# Patient Record
Sex: Female | Born: 1950 | Race: White | Hispanic: No | Marital: Married | Smoking: Never smoker
Health system: Southern US, Community
[De-identification: ages and names within clinical notes are randomized; demographics above are authoritative.]

## PROBLEM LIST (undated history)

## (undated) DIAGNOSIS — T7840XA Allergy, unspecified, initial encounter: Secondary | ICD-10-CM

## (undated) DIAGNOSIS — F319 Bipolar disorder, unspecified: Secondary | ICD-10-CM

## (undated) DIAGNOSIS — I10 Essential (primary) hypertension: Secondary | ICD-10-CM

## (undated) DIAGNOSIS — M797 Fibromyalgia: Secondary | ICD-10-CM

## (undated) DIAGNOSIS — C801 Malignant (primary) neoplasm, unspecified: Secondary | ICD-10-CM

## (undated) DIAGNOSIS — G8929 Other chronic pain: Secondary | ICD-10-CM

## (undated) DIAGNOSIS — F419 Anxiety disorder, unspecified: Secondary | ICD-10-CM

## (undated) DIAGNOSIS — F32A Depression, unspecified: Secondary | ICD-10-CM

## (undated) DIAGNOSIS — R519 Headache, unspecified: Secondary | ICD-10-CM

## (undated) DIAGNOSIS — E7800 Pure hypercholesterolemia, unspecified: Secondary | ICD-10-CM

## (undated) HISTORY — DX: Fibromyalgia: M79.7

## (undated) HISTORY — PX: BREAST ENHANCEMENT SURGERY: SHX7

## (undated) HISTORY — DX: Headache, unspecified: R51.9

## (undated) HISTORY — DX: Depression, unspecified: F32.A

## (undated) HISTORY — DX: Essential (primary) hypertension: I10

## (undated) HISTORY — DX: Allergy, unspecified, initial encounter: T78.40XA

## (undated) HISTORY — DX: Pure hypercholesterolemia, unspecified: E78.00

## (undated) HISTORY — PX: OTHER SURGICAL HISTORY: SHX169

## (undated) HISTORY — PX: ESOPHAGOGASTRODUODENOSCOPY: SHX1529

## (undated) HISTORY — DX: Other chronic pain: G89.29

## (undated) HISTORY — PX: VAGINAL PROLAPSE REPAIR: SHX830

## (undated) HISTORY — PX: ABDOMINAL HYSTERECTOMY: SHX81

## (undated) HISTORY — PX: AUGMENTATION MAMMAPLASTY: SUR837

---

## 1990-02-04 HISTORY — PX: ULNA OSTEOTOMY: SHX1077

## 1998-02-14 ENCOUNTER — Emergency Department (HOSPITAL_COMMUNITY): Admission: EM | Admit: 1998-02-14 | Discharge: 1998-02-14 | Payer: Self-pay

## 2001-01-21 ENCOUNTER — Encounter: Admission: RE | Admit: 2001-01-21 | Discharge: 2001-04-21 | Payer: Self-pay

## 2001-02-16 ENCOUNTER — Ambulatory Visit (HOSPITAL_COMMUNITY): Admission: RE | Admit: 2001-02-16 | Discharge: 2001-02-16 | Payer: Self-pay

## 2001-04-29 ENCOUNTER — Encounter: Admission: RE | Admit: 2001-04-29 | Discharge: 2001-07-28 | Payer: Self-pay

## 2001-05-06 ENCOUNTER — Encounter: Admission: RE | Admit: 2001-05-06 | Discharge: 2001-08-04 | Payer: Self-pay

## 2001-06-24 ENCOUNTER — Other Ambulatory Visit: Admission: RE | Admit: 2001-06-24 | Discharge: 2001-06-24 | Payer: Self-pay | Admitting: Gynecology

## 2001-12-09 ENCOUNTER — Inpatient Hospital Stay (HOSPITAL_COMMUNITY): Admission: RE | Admit: 2001-12-09 | Discharge: 2001-12-10 | Payer: Self-pay | Admitting: Gynecology

## 2003-01-14 ENCOUNTER — Ambulatory Visit (HOSPITAL_COMMUNITY): Admission: RE | Admit: 2003-01-14 | Discharge: 2003-01-14 | Payer: Self-pay | Admitting: Internal Medicine

## 2003-06-06 ENCOUNTER — Emergency Department (HOSPITAL_COMMUNITY): Admission: EM | Admit: 2003-06-06 | Discharge: 2003-06-06 | Payer: Self-pay | Admitting: Family Medicine

## 2003-08-31 ENCOUNTER — Ambulatory Visit (HOSPITAL_COMMUNITY): Admission: RE | Admit: 2003-08-31 | Discharge: 2003-08-31 | Payer: Self-pay | Admitting: Orthopedic Surgery

## 2003-09-16 ENCOUNTER — Ambulatory Visit (HOSPITAL_BASED_OUTPATIENT_CLINIC_OR_DEPARTMENT_OTHER): Admission: RE | Admit: 2003-09-16 | Discharge: 2003-09-16 | Payer: Self-pay | Admitting: Orthopedic Surgery

## 2003-09-16 ENCOUNTER — Ambulatory Visit (HOSPITAL_COMMUNITY): Admission: RE | Admit: 2003-09-16 | Discharge: 2003-09-16 | Payer: Self-pay | Admitting: Orthopedic Surgery

## 2004-02-27 ENCOUNTER — Ambulatory Visit: Payer: Self-pay | Admitting: Internal Medicine

## 2004-03-29 ENCOUNTER — Emergency Department (HOSPITAL_COMMUNITY): Admission: EM | Admit: 2004-03-29 | Discharge: 2004-03-29 | Payer: Self-pay | Admitting: Emergency Medicine

## 2004-03-30 ENCOUNTER — Emergency Department (HOSPITAL_COMMUNITY): Admission: EM | Admit: 2004-03-30 | Discharge: 2004-03-30 | Payer: Self-pay | Admitting: Emergency Medicine

## 2004-04-07 ENCOUNTER — Emergency Department (HOSPITAL_COMMUNITY): Admission: EM | Admit: 2004-04-07 | Discharge: 2004-04-07 | Payer: Self-pay | Admitting: Family Medicine

## 2004-04-24 ENCOUNTER — Emergency Department (HOSPITAL_COMMUNITY): Admission: EM | Admit: 2004-04-24 | Discharge: 2004-04-24 | Payer: Self-pay | Admitting: Emergency Medicine

## 2004-04-25 ENCOUNTER — Inpatient Hospital Stay (HOSPITAL_COMMUNITY): Admission: EM | Admit: 2004-04-25 | Discharge: 2004-05-08 | Payer: Self-pay | Admitting: Psychiatry

## 2004-04-25 ENCOUNTER — Ambulatory Visit (HOSPITAL_COMMUNITY): Admission: RE | Admit: 2004-04-25 | Discharge: 2004-04-25 | Payer: Self-pay | Admitting: Psychiatry

## 2004-04-25 ENCOUNTER — Ambulatory Visit: Payer: Self-pay | Admitting: Psychiatry

## 2004-05-03 ENCOUNTER — Ambulatory Visit (HOSPITAL_COMMUNITY): Admission: RE | Admit: 2004-05-03 | Discharge: 2004-05-03 | Payer: Self-pay | Admitting: Psychiatry

## 2004-06-07 ENCOUNTER — Emergency Department (HOSPITAL_COMMUNITY): Admission: EM | Admit: 2004-06-07 | Discharge: 2004-06-07 | Payer: Self-pay | Admitting: Emergency Medicine

## 2004-06-19 ENCOUNTER — Ambulatory Visit: Payer: Self-pay | Admitting: Internal Medicine

## 2004-06-29 ENCOUNTER — Encounter: Admission: RE | Admit: 2004-06-29 | Discharge: 2004-06-29 | Payer: Self-pay | Admitting: Family Medicine

## 2004-07-12 ENCOUNTER — Ambulatory Visit: Payer: Self-pay | Admitting: Internal Medicine

## 2009-11-29 ENCOUNTER — Encounter (INDEPENDENT_AMBULATORY_CARE_PROVIDER_SITE_OTHER): Payer: Self-pay | Admitting: *Deleted

## 2010-02-25 ENCOUNTER — Encounter: Payer: Self-pay | Admitting: Internal Medicine

## 2010-03-06 NOTE — Letter (Signed)
Summary: LEC Referral (unable to schedule) Notification  [facility]  [address] [place], [place] [address]   Phone: [phone]  Fax: [phone]      November 29, 2009 Angela Ortiz 1950/12/26 MRN: [id number]   [facility] [id number]  [place], [place]  [address]   Dear Dr.Pang:   Thank you for your kind referral of the above patient. We have attempted to schedule the recommended Colonoscopy but have been unable to schedule because:  __ The patient was not available by phone and/or has not returned our calls.  _x_ The patient declined to schedule the procedure at this time.  We appreciate the referral and hope that we will have the opportunity to treat this patient in the future.    Sincerely,   [facility]  Vania Rea. Jarold Motto M.D. Hedwig Morton. Juanda Chance M.D. Venita Lick. Russella Dar M.D. Wilhemina Bonito. Marina Goodell M.D. Barbette Hair. Arlyce Dice M.D. Iva Boop M.D. Cheron Every.D.

## 2010-09-26 ENCOUNTER — Emergency Department (HOSPITAL_COMMUNITY)
Admission: EM | Admit: 2010-09-26 | Discharge: 2010-09-26 | Discharge status: Against Medical Advice | Payer: BC Managed Care – PPO | Attending: Emergency Medicine | Admitting: Emergency Medicine

## 2010-09-26 DIAGNOSIS — R5381 Other malaise: Secondary | ICD-10-CM | POA: Insufficient documentation

## 2010-09-26 DIAGNOSIS — R5383 Other fatigue: Secondary | ICD-10-CM | POA: Insufficient documentation

## 2010-09-26 DIAGNOSIS — R110 Nausea: Secondary | ICD-10-CM | POA: Insufficient documentation

## 2013-07-02 ENCOUNTER — Other Ambulatory Visit: Payer: Self-pay | Admitting: Internal Medicine

## 2013-07-02 DIAGNOSIS — R109 Unspecified abdominal pain: Secondary | ICD-10-CM

## 2013-07-08 ENCOUNTER — Encounter (INDEPENDENT_AMBULATORY_CARE_PROVIDER_SITE_OTHER): Payer: Self-pay

## 2013-07-08 ENCOUNTER — Ambulatory Visit
Admission: RE | Admit: 2013-07-08 | Discharge: 2013-07-08 | Disposition: A | Discharge status: Home / Self Care | Payer: BC Managed Care – PPO | Source: Ambulatory Visit | Attending: Internal Medicine | Admitting: Internal Medicine

## 2013-07-08 DIAGNOSIS — R109 Unspecified abdominal pain: Secondary | ICD-10-CM

## 2013-07-08 MED ORDER — IOHEXOL 300 MG/ML  SOLN
100.0000 mL | Freq: Once | INTRAMUSCULAR | Status: AC | PRN
  Administered 2013-07-08: 100 mL via INTRAVENOUS

## 2014-05-17 ENCOUNTER — Emergency Department (HOSPITAL_COMMUNITY)
Admission: EM | Admit: 2014-05-17 | Discharge: 2014-05-18 | Disposition: A | Discharge status: Home / Self Care | Payer: Medicare Other | Attending: Emergency Medicine | Admitting: Emergency Medicine

## 2014-05-17 ENCOUNTER — Emergency Department (HOSPITAL_COMMUNITY): Payer: Medicare Other

## 2014-05-17 ENCOUNTER — Encounter (HOSPITAL_COMMUNITY): Payer: Self-pay | Admitting: Emergency Medicine

## 2014-05-17 DIAGNOSIS — R443 Hallucinations, unspecified: Secondary | ICD-10-CM

## 2014-05-17 DIAGNOSIS — G4700 Insomnia, unspecified: Secondary | ICD-10-CM

## 2014-05-17 DIAGNOSIS — F419 Anxiety disorder, unspecified: Secondary | ICD-10-CM | POA: Diagnosis present

## 2014-05-17 DIAGNOSIS — F410 Panic disorder [episodic paroxysmal anxiety] without agoraphobia: Secondary | ICD-10-CM | POA: Insufficient documentation

## 2014-05-17 DIAGNOSIS — Z85830 Personal history of malignant neoplasm of bone: Secondary | ICD-10-CM | POA: Insufficient documentation

## 2014-05-17 HISTORY — DX: Bipolar disorder, unspecified: F31.9

## 2014-05-17 HISTORY — DX: Anxiety disorder, unspecified: F41.9

## 2014-05-17 HISTORY — DX: Malignant (primary) neoplasm, unspecified: C80.1

## 2014-05-17 LAB — CBC
HCT: 38.9 % (ref 36.0–46.0)
Hemoglobin: 13.4 g/dL (ref 12.0–15.0)
MCH: 31.3 pg (ref 26.0–34.0)
MCHC: 34.4 g/dL (ref 30.0–36.0)
MCV: 90.9 fL (ref 78.0–100.0)
Platelets: 242 10*3/uL (ref 150–400)
RBC: 4.28 MIL/uL (ref 3.87–5.11)
RDW: 12.9 % (ref 11.5–15.5)
WBC: 10.8 10*3/uL — ABNORMAL HIGH (ref 4.0–10.5)

## 2014-05-17 LAB — URINE MICROSCOPIC-ADD ON

## 2014-05-17 LAB — COMPREHENSIVE METABOLIC PANEL
ALT: 16 U/L (ref 0–35)
AST: 34 U/L (ref 0–37)
Albumin: 4.8 g/dL (ref 3.5–5.2)
Alkaline Phosphatase: 57 U/L (ref 39–117)
Anion gap: 12 (ref 5–15)
BUN: 9 mg/dL (ref 6–23)
CO2: 25 mmol/L (ref 19–32)
Calcium: 9.4 mg/dL (ref 8.4–10.5)
Chloride: 95 mmol/L — ABNORMAL LOW (ref 96–112)
Creatinine, Ser: 0.66 mg/dL (ref 0.50–1.10)
GFR calc Af Amer: >90 mL/min (ref >90)
GFR calc non Af Amer: >90 mL/min (ref >90)
Glucose, Bld: 122 mg/dL — ABNORMAL HIGH (ref 70–99)
Potassium: 3.1 mmol/L — ABNORMAL LOW (ref 3.5–5.1)
Sodium: 132 mmol/L — ABNORMAL LOW (ref 135–145)
Total Bilirubin: 1 mg/dL (ref 0.3–1.2)
Total Protein: 7.3 g/dL (ref 6.0–8.3)

## 2014-05-17 LAB — URINALYSIS, ROUTINE W REFLEX MICROSCOPIC
Bilirubin Urine: NEGATIVE
Glucose, UA: NEGATIVE mg/dL
Ketones, ur: 15 mg/dL — AB
Nitrite: NEGATIVE
Protein, ur: NEGATIVE mg/dL
Specific Gravity, Urine: 1.003 — ABNORMAL LOW (ref 1.005–1.030)
Urobilinogen, UA: 0.2 mg/dL (ref 0.0–1.0)
pH: 8 (ref 5.0–8.0)

## 2014-05-17 LAB — RAPID URINE DRUG SCREEN, HOSP PERFORMED
Amphetamines: NOT DETECTED
Barbiturates: NOT DETECTED
Benzodiazepines: NOT DETECTED
Cocaine: NOT DETECTED
Opiates: NOT DETECTED
Tetrahydrocannabinol: NOT DETECTED

## 2014-05-17 LAB — SALICYLATE LEVEL: Salicylate Lvl: <4 mg/dL (ref 2.8–20.0)

## 2014-05-17 LAB — ETHANOL: Alcohol, Ethyl (B): <5 mg/dL (ref 0–9)

## 2014-05-17 LAB — ACETAMINOPHEN LEVEL: Acetaminophen (Tylenol), Serum: <10 ug/mL — ABNORMAL LOW (ref 10–30)

## 2014-05-17 MED ORDER — ACETAMINOPHEN 325 MG PO TABS
650.0000 mg | ORAL_TABLET | ORAL | Status: DC | PRN
  Administered 2014-05-18: 650 mg via ORAL
  Filled 2014-05-17: qty 2

## 2014-05-17 MED ORDER — LORAZEPAM 1 MG PO TABS
1.0000 mg | ORAL_TABLET | Freq: Three times a day (TID) | ORAL | Status: DC | PRN
  Administered 2014-05-18: 1 mg via ORAL
  Filled 2014-05-17: qty 1
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
## 2014-05-17 NOTE — ED Notes (Signed)
Patient sister Angela Ortiz) [phone]  Patient gave verbal consent for sister to be updated on POC.

## 2014-05-17 NOTE — ED Notes (Signed)
Patient transported to X-ray 

## 2014-05-17 NOTE — ED Notes (Signed)
Bed: WA16 Expected date:  Expected time:  Means of arrival:  Comments: tr1 

## 2014-05-17 NOTE — ED Notes (Signed)
Pt's belongings placed in [place].

## 2014-05-17 NOTE — ED Provider Notes (Signed)
CSN: [id number]     Arrival date & time 05/17/14  1833 History   First MD Initiated Contact with Patient 05/17/14 1954     Chief Complaint  Patient presents with  . Hallucinations    visual  . Panic Attack    throught the day, hx of same     (Consider location/radiation/quality/duration/timing/severity/associated sxs/prior Treatment) The history is provided by the patient.   patient presents with auditory and visual hallucinations. Has also been more anxious. History of anxiety and bipolar disorder. No suicidal thoughts but states she's been in with a lot of stress. Has been seeing things moving along the walls in her room. Sent in by her psychiatrist. She denies substance abuse.  Past Medical History  Diagnosis Date  . Bipolar disorder   . Anxiety   . 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 Right arm surgery    . Breast enhancement surgery    . Abdominal hysterectomy     History reviewed. No pertinent family history. History  Substance Use Topics  . Smoking status: Never Smoker   . Smokeless tobacco: Not on file  . Alcohol Use: 4.2 oz/week    7 Glasses of wine per week   OB History    No data available     Review of Systems  Constitutional: Negative for activity change and appetite change.  Eyes: Negative for pain.  Respiratory: Negative for chest tightness and shortness of breath.   Cardiovascular: Negative for chest pain and leg swelling.  Gastrointestinal: Negative for nausea, vomiting, abdominal pain and diarrhea.  Genitourinary: Negative for flank pain.  Musculoskeletal: Negative for back pain and neck stiffness.  Skin: Negative for rash.  Neurological: Negative for weakness, numbness and headaches.  Psychiatric/Behavioral: Positive for hallucinations and behavioral problems. The patient is nervous/anxious.       Allergies  Review of patient's allergies indicates no known allergies.  Home Medications    Prior to Admission medications   Not on File   BP 140/70 mmHg  Pulse 77  Temp(Src) 98.4 F (36.9 C) (Oral)  Resp 20  Ht 5\' 6"  (1.676 m)  Wt 155 lb (70.308 kg)  BMI 25.03 kg/m2  SpO2 95% Physical Exam  Constitutional: She is oriented to person, place, and time. She appears well-developed and well-nourished.  HENT:  Head: Normocephalic and atraumatic.  Eyes: EOM are normal. Pupils are equal, round, and reactive to light.  Neck: Normal range of motion.  Cardiovascular: Normal rate, regular rhythm and normal heart sounds.   Pulmonary/Chest: Effort normal and breath sounds normal. No respiratory distress.  Abdominal: Soft. There is no tenderness.  Musculoskeletal: Normal range of motion.  Neurological: She is alert and oriented to person, place, and time. No cranial nerve deficit.  Skin: Skin is warm and dry.  Psychiatric: Her speech is normal.  Patient appears somewhat anxious.  Nursing note and vitals reviewed.   ED Course  Procedures (including critical care time) Labs Review Labs Reviewed  ACETAMINOPHEN LEVEL - Abnormal; Notable for the following:    Acetaminophen (Tylenol), Serum <10.0 (*)    All other components within normal limits  CBC - Abnormal; Notable for the following:    WBC 10.8 (*)    All other components within normal limits  COMPREHENSIVE METABOLIC PANEL - Abnormal; Notable for the following:    Sodium 132 (*)    Potassium 3.1 (*)    Chloride 95 (*)  Glucose, Bld 122 (*)    All other components within normal limits  URINALYSIS, ROUTINE W REFLEX MICROSCOPIC - Abnormal; Notable for the following:    Specific Gravity, Urine 1.003 (*)    Hgb urine dipstick SMALL (*)    Ketones, ur 15 (*)    Leukocytes, UA SMALL (*)    All other components within normal limits  ETHANOL  SALICYLATE LEVEL  URINE RAPID DRUG SCREEN (HOSP PERFORMED)  URINE MICROSCOPIC-ADD ON    Imaging Review Dg Chest 2 View  05/17/2014   CLINICAL DATA:  Hallucinations. Panic  attack. Remote history of right upper extremity carcinoma.  EXAM: CHEST  2 VIEW  COMPARISON:  08/18/2013  FINDINGS: Lateral view degraded by patient arm position. Calcified left breast implant. Midline trachea. Normal heart size and mediastinal contours. No pleural effusion or pneumothorax. Mild right infrahilar scarring or subsegmental atelectasis.  IMPRESSION: No acute cardiopulmonary disease.   Electronically Signed   By: Abigail Miyamoto M.D.   On: 05/17/2014 21:05     EKG Interpretation None      MDM   Final diagnoses:  Hallucinations  Anxiety    Patient with hallucinations and anxiety. History of same and bipolar disorder. Appears to medically cleared at this time. To be seen by TTS.    Davonna Belling, MD 05/17/14 2154

## 2014-05-17 NOTE — ED Notes (Addendum)
Patient reports hx of bipolar and panic attacks, states she has been having panic attacks throughout the day. Sent by psychiatrist (Dr. Edwyna Perfect). Patient also reports visual hallucinations, patterns on wall moving, denies anything scary or frightening. Patient ran out of Xanax yesterday, states she is taking her medications for bipolar. Denies SI/HI. Patient has been telling sister that she has been afraid of dieing the past few days, states "in the middle of the night she goes to dark places", is seeing another solar system. Patient states to her sister she found a piece of plastic in her wallet today and swallowed it. Patient has been spitting and telling her sister "look there's the plastic", sister has been unable to visualize anything abnormal in the spit.

## 2014-05-17 NOTE — Progress Notes (Signed)
EDCM spoke to patient at bedside. Patient presents to Ed with auditory and visual hallucinations. Patient with pmhx of bipolar and anxiety.   Patient confirms her pcp is Dr. Tommy Medal.  Patient's psychiatrist is Dr. Toy Care on [address] [phone].  [facility] asked patient if she is taking her medications correctly?  Patient reports she ran out of her xanax yesterday.  Patient also reports she has not been taking her antidepressant medications.  Patient unable to tell [facility] name of antidepressant medication she takes.  [facility] asked patient if she has had hallucinations in the past?  Patient reports she has had hallucinations in the past but cannot remember what made it better.  Patient very pleasant when [facility] spoke with her at this time.  TTS consult placed. No further EDCM needs at this time.

## 2014-05-18 DIAGNOSIS — F419 Anxiety disorder, unspecified: Secondary | ICD-10-CM | POA: Diagnosis not present

## 2014-05-18 DIAGNOSIS — F410 Panic disorder [episodic paroxysmal anxiety] without agoraphobia: Secondary | ICD-10-CM | POA: Diagnosis present

## 2014-05-18 DIAGNOSIS — G4700 Insomnia, unspecified: Secondary | ICD-10-CM

## 2014-05-18 NOTE — Progress Notes (Addendum)
Per psychiatrist, patient psychiatrically stable for discharge home. Pt is followed by Dr. Starleen Arms office. Pt recommended to see therapist in Dr. Starleen Arms office. Friday 4/15 at 12pm with Dr. Toy Care. Patient referred to [facility]. Pt has an appointment tomorrow at [place] for therapy. Pt agreeable to disposition plan. Pt calling husband and sister for ride home.    Angela Ortiz, [place] Work  [employer] [phone]

## 2014-05-18 NOTE — ED Notes (Signed)
Psych Team in with patient.

## 2014-05-18 NOTE — Consult Note (Signed)
[facility] Consult   Reason for Consult:  Anxiety, panic disorder Referring Physician:  EDP Patient Identification: Angela Ortiz MRN:  [id number] Principal Diagnosis: <principal problem not specified> Diagnosis:   Patient Active Problem List   Diagnosis Date Noted  . Anxiety [F41.9] 05/18/2014    Priority: High  . Panic disorder [F41.0] 05/18/2014    Priority: High  . Insomnia [G47.00] 05/18/2014    Priority: High    Total Time spent with patient: 45 minutes  Subjective:   Angela Ortiz is a 64 y.o. female patient has stabilized.  HPI:  The patient has been having anxiety for years and taking Xanax 1 mg QID for the past 3-4 years.  She has been over taking her Xanax and ran out yesterday.  Angela Ortiz then started having an increase in anxiety with a panic attack.  She states she was worried about the end of the world after watching a documentary about a new planet written by a physicist.  Denies suicidal/homicidal ideations, hallucinations, and alcohol/illegal drug abuse, no past suicide attempts or hospitalization.  She is a patient of Dr. Toy Care but does not have a therapist but willing to go to one.  Angela Ortiz lives with her husband and has supportive adult children.  Stable for discharge. HPI Elements:   Location:  generalized. Quality:  acute. Severity:  mild. Timing:  intermttent. Duration:  since yesterday. Context:  stressors, ran out of her Xanax.  Past Medical History:  Past Medical History  Diagnosis Date  . Bipolar disorder   . Anxiety   . 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 Right arm surgery    . Breast enhancement surgery    . Abdominal hysterectomy     Family History: History reviewed. No pertinent family history. Social History:  History  Alcohol Use  . 4.2 oz/week  . 7 Glasses of wine per week     History  Drug Use No    History   Social History  . Marital Status: Married    Spouse  Name: N/A  . Number of Children: N/A  . Years of Education: N/A   Social History Main Topics  . Smoking status: Never Smoker   . Smokeless tobacco: Not on file  . Alcohol Use: 4.2 oz/week    7 Glasses of wine per week  . Drug Use: No  . Sexual Activity: Not on file   Other Topics Concern  . None   Social History Narrative  . None   Additional Social History:    Pain Medications: Tramadol for fiibromyalgia Prescriptions: Xanax, and other medications.  See PTA medication list Over the Counter: Calcium, ibuprophen when needed, Vitamin D-3 History of alcohol / drug use?: No history of alcohol / drug abuse                     Allergies:  No Known Allergies  Labs:  Results for orders placed or performed during the hospital encounter of 05/17/14 (from the past 48 hour(s))  Urine Drug Screen     Status: None   Collection Time: 05/17/14  7:53 PM  Result Value Ref Range   Opiates NONE DETECTED NONE DETECTED   Cocaine NONE DETECTED NONE DETECTED   Benzodiazepines NONE DETECTED NONE DETECTED   Amphetamines NONE DETECTED NONE DETECTED   Tetrahydrocannabinol NONE DETECTED NONE DETECTED   Barbiturates NONE DETECTED NONE DETECTED    Comment:  DRUG SCREEN FOR MEDICAL PURPOSES ONLY.  IF CONFIRMATION IS NEEDED FOR ANY PURPOSE, NOTIFY LAB WITHIN 5 DAYS.        LOWEST DETECTABLE LIMITS FOR URINE DRUG SCREEN Drug Class       Cutoff (ng/mL) Amphetamine      1000 Barbiturate      200 Benzodiazepine   845 Tricyclics       364 Opiates          300 Cocaine          300 THC              50   Urinalysis, Routine w reflex microscopic     Status: Abnormal   Collection Time: 05/17/14  7:53 PM  Result Value Ref Range   Color, Urine YELLOW YELLOW   APPearance CLEAR CLEAR   Specific Gravity, Urine 1.003 (L) 1.005 - 1.030   pH 8.0 5.0 - 8.0   Glucose, UA NEGATIVE NEGATIVE mg/dL   Hgb urine dipstick SMALL (A) NEGATIVE   Bilirubin Urine NEGATIVE NEGATIVE   Ketones, ur 15 (A)  NEGATIVE mg/dL   Protein, ur NEGATIVE NEGATIVE mg/dL   Urobilinogen, UA 0.2 0.0 - 1.0 mg/dL   Nitrite NEGATIVE NEGATIVE   Leukocytes, UA SMALL (A) NEGATIVE  Urine microscopic-add on     Status: None   Collection Time: 05/17/14  7:53 PM  Result Value Ref Range   Squamous Epithelial / LPF RARE RARE   WBC, UA 3-6 <3 WBC/hpf   RBC / HPF 0-2 <3 RBC/hpf   Bacteria, UA RARE RARE  Acetaminophen level     Status: Abnormal   Collection Time: 05/17/14  8:29 PM  Result Value Ref Range   Acetaminophen (Tylenol), Serum <10.0 (L) 10 - 30 ug/mL    Comment:        THERAPEUTIC CONCENTRATIONS VARY SIGNIFICANTLY. A RANGE OF 10-30 ug/mL MAY BE AN EFFECTIVE CONCENTRATION FOR MANY PATIENTS. HOWEVER, SOME ARE BEST TREATED AT CONCENTRATIONS OUTSIDE THIS RANGE. ACETAMINOPHEN CONCENTRATIONS >150 ug/mL AT 4 HOURS AFTER INGESTION AND >50 ug/mL AT 12 HOURS AFTER INGESTION ARE OFTEN ASSOCIATED WITH TOXIC REACTIONS.   CBC     Status: Abnormal   Collection Time: 05/17/14  8:29 PM  Result Value Ref Range   WBC 10.8 (H) 4.0 - 10.5 K/uL   RBC 4.28 3.87 - 5.11 MIL/uL   Hemoglobin 13.4 12.0 - 15.0 g/dL   HCT 38.9 36.0 - 46.0 %   MCV 90.9 78.0 - 100.0 fL   MCH 31.3 26.0 - 34.0 pg   MCHC 34.4 30.0 - 36.0 g/dL   RDW 12.9 11.5 - 15.5 %   Platelets 242 150 - 400 K/uL  Comprehensive metabolic panel     Status: Abnormal   Collection Time: 05/17/14  8:29 PM  Result Value Ref Range   Sodium 132 (L) 135 - 145 mmol/L   Potassium 3.1 (L) 3.5 - 5.1 mmol/L   Chloride 95 (L) 96 - 112 mmol/L   CO2 25 19 - 32 mmol/L   Glucose, Bld 122 (H) 70 - 99 mg/dL   BUN 9 6 - 23 mg/dL   Creatinine, Ser 0.66 0.50 - 1.10 mg/dL   Calcium 9.4 8.4 - 10.5 mg/dL   Total Protein 7.3 6.0 - 8.3 g/dL   Albumin 4.8 3.5 - 5.2 g/dL   AST 34 0 - 37 U/L   ALT 16 0 - 35 U/L   Alkaline Phosphatase 57 39 - 117 U/L   Total Bilirubin 1.0 0.3 -  1.2 mg/dL   GFR calc non Af Amer >90 >90 mL/min   GFR calc Af Amer >90 >90 mL/min    Comment:  (NOTE) The eGFR has been calculated using the CKD EPI equation. This calculation has not been validated in all clinical situations. eGFR's persistently <90 mL/min signify possible Chronic Kidney Disease.    Anion gap 12 5 - 15  Ethanol (ETOH)     Status: None   Collection Time: 05/17/14  8:29 PM  Result Value Ref Range   Alcohol, Ethyl (B) <5 0 - 9 mg/dL    Comment:        LOWEST DETECTABLE LIMIT FOR SERUM ALCOHOL IS 11 mg/dL FOR MEDICAL PURPOSES ONLY   Salicylate level     Status: None   Collection Time: 05/17/14  8:29 PM  Result Value Ref Range   Salicylate Lvl <2.2 2.8 - 20.0 mg/dL    Vitals: Blood pressure 123/59, pulse 73, temperature 98.4 F (36.9 C), temperature source Oral, resp. rate 16, height _0  (1.676 m), weight 155 lb (70.308 kg), SpO2 97 %.  Risk to Self: Suicidal Ideation: No Suicidal Intent: No Is patient at risk for suicide?: No Suicidal Plan?: No Access to Means: No What has been your use of drugs/alcohol within the last 12 months?: One glass of wine in a week How many times?: 0 Other Self Harm Risks: None Triggers for Past Attempts: None known Intentional Self Injurious Behavior: None Risk to Others: Homicidal Ideation: No Thoughts of Harm to Others: No Current Homicidal Intent: No Current Homicidal Plan: No Access to Homicidal Means: No Identified Victim: No one History of harm to others?: No Assessment of Violence: None Noted Violent Behavior Description: No one Does patient have access to weapons?: No Criminal Charges Pending?: No Does patient have a court date: No Prior Inpatient Therapy: Prior Inpatient Therapy: Yes Prior Therapy Dates: 10 years ago Prior Therapy Facilty/Provider([facility]): [facility] Reason for Treatment: panic attacks Prior Outpatient Therapy: Prior Outpatient Therapy: Yes Prior Therapy Dates: 10 years ago Prior Therapy Facilty/Provider(s): Dr. Toy Care Reason for Treatment: med management  Current Facility-Administered Medications   Medication Dose Route Frequency Provider Last Rate Last Dose  . acetaminophen (TYLENOL) tablet 650 mg  650 mg Oral Q4H PRN Davonna Belling, MD   650 mg at 05/18/14 9798   Current Outpatient Prescriptions  Medication Sig Dispense Refill  . ALPRAZolam (XANAX) 1 MG tablet Take 1 mg by mouth 4 (four) times daily.    Marland Kitchen atorvastatin (LIPITOR) 10 MG tablet Take 10 mg by mouth daily.    . traMADol (ULTRAM) 50 MG tablet Take 50 mg by mouth 3 (three) times daily.      Musculoskeletal: Strength & Muscle Tone: within normal limits Gait & Station: normal Patient leans: N/A  Psychiatric Specialty Exam:     Blood pressure 123/59, pulse 73, temperature 98.4 F (36.9 C), temperature source Oral, resp. rate 16, height _1  (1.676 m), weight 155 lb (70.308 kg), SpO2 97 %.Body mass index is 25.03 kg/(m^2).  General Appearance: Casual  Eye Contact::  Good  Speech:  Normal Rate  Volume:  Normal  Mood:  Euthymic  Affect:  Congruent  Thought Process:  Coherent  Orientation:  Full (Time, Place, and Person)  Thought Content:  WDL  Suicidal Thoughts:  No  Homicidal Thoughts:  No  Memory:  Immediate;   Good Recent;   Good Remote;   Good  Judgement:  Good  Insight:  Fair  Psychomotor Activity:  Normal  Concentration:  Good  Recall:  Good  Fund of Knowledge:Good  Language: Good  Akathisia:  No  Handed:  Right  AIMS (if indicated):     Assets:  Housing Intimacy Leisure Time Physical Health Resilience Social Support  ADL's:  Intact  Cognition: WNL  Sleep:      Medical Decision Making: Review of Psycho-Social Stressors (1), Review or order clinical lab tests (1) and Review of Medication Regimen & Side Effects (2)  Treatment Plan Summary: Daily contact with patient to assess and evaluate symptoms and progress in treatment, Medication management and Plan discharge home with follow-up with her regular provider, resources provided for therapy  Plan:  No evidence of imminent risk to self or  others at present.   Disposition: discharge home with follow-up with her regular provider, resources provided for therapy---therapist appointment tomorrow and appointment with Dr. Toy Care on Friday.  Waylan Boga, [place] 05/18/2014 12:32 PM Patient seen face-to-face for psychiatric evaluation, chart reviewed and case discussed with the physician extender and developed treatment plan. Reviewed the information documented and agree with the treatment plan. Corena Pilgrim, MD

## 2014-05-18 NOTE — [facility] Suicide Risk Assessment (Signed)
Suicide Risk Assessment  Discharge Assessment   [facility] Discharge Suicide Risk Assessment   Demographic Factors:  Age 65 or older and Caucasian  Total Time spent with patient: 45 minutes  Musculoskeletal: Strength & Muscle Tone: within normal limits Gait & Station: normal Patient leans: N/A  Psychiatric Specialty Exam:     Blood pressure 123/59, pulse 73, temperature 98.4 F (36.9 C), temperature source Oral, resp. rate 16, height 5\' 6"  (1.676 m), weight 155 lb (70.308 kg), SpO2 97 %.Body mass index is 25.03 kg/(m^2).  General Appearance: Casual  Eye Contact::  Good  Speech:  Normal Rate  Volume:  Normal  Mood:  Euthymic  Affect:  Congruent  Thought Process:  Coherent  Orientation:  Full (Time, Place, and Person)  Thought Content:  WDL  Suicidal Thoughts:  No  Homicidal Thoughts:  No  Memory:  Immediate;   Good Recent;   Good Remote;   Good  Judgement:  Good  Insight:  Fair  Psychomotor Activity:  Normal  Concentration:  Good  Recall:  Good  Fund of Knowledge:Good  Language: Good  Akathisia:  No  Handed:  Right  AIMS (if indicated):     Assets:  Housing Intimacy Leisure Time Physical Health Resilience Social Support  ADL's:  Intact  Cognition: WNL  Sleep:         Has this patient used any form of tobacco in the last 30 days? (Cigarettes, Smokeless Tobacco, Cigars, and/or Pipes) No  Mental Status Per Nursing Assessment::   On Admission:    Anxiety, panic disorder  Current Mental Status by Physician: NA  Loss Factors: NA  Historical Factors: NA  Risk Reduction Factors:   Sense of responsibility to family, Living with another person, especially a relative, Positive social support and Positive therapeutic relationship  Continued Clinical Symptoms:  None  Cognitive Features That Contribute To Risk:  None    Suicide Risk:  Minimal: No identifiable suicidal ideation.  Patients presenting with no risk factors but with morbid ruminations; may be  classified as minimal risk based on the severity of the depressive symptoms  Principal Problem: <principal problem not specified> Discharge Diagnoses:  Patient Active Problem List   Diagnosis Date Noted  . Anxiety [F41.9] 05/18/2014    Priority: High  . Panic disorder [F41.0] 05/18/2014    Priority: High  . Insomnia [G47.00] 05/18/2014    Priority: High    Follow-up Information    Follow up On 05/20/2014.   Why:  Dr. Toy Care Friday 05/20/2014 @ 12pm    Contact information:   [address]  [place], [place] [address]  [phone]      Follow up On 05/19/2014.   Why:  Therapist Foy Guadalajara. at 3:45pm for appointment at [place] information:   [address]  [place],  [address]  [phone]      Plan Of Care/Follow-up recommendations:  Activity:  as tolerated  Diet:  heart healthy diet  Is patient on multiple antipsychotic therapies at discharge:  No   Has Patient had three or more failed trials of antipsychotic monotherapy by history:  No  Recommended Plan for Multiple Antipsychotic Therapies: NA    Krithik Mapel, PMH-NP 05/18/2014, 12:41 PM

## 2014-06-07 ENCOUNTER — Emergency Department (HOSPITAL_COMMUNITY): Payer: Medicare Other

## 2014-06-07 ENCOUNTER — Emergency Department (HOSPITAL_COMMUNITY)
Admission: EM | Admit: 2014-06-07 | Discharge: 2014-06-08 | Disposition: A | Discharge status: Home / Self Care | Payer: Medicare Other | Attending: Emergency Medicine | Admitting: Emergency Medicine

## 2014-06-07 ENCOUNTER — Encounter (HOSPITAL_COMMUNITY): Payer: Self-pay

## 2014-06-07 DIAGNOSIS — F309 Manic episode, unspecified: Secondary | ICD-10-CM | POA: Insufficient documentation

## 2014-06-07 DIAGNOSIS — F1310 Sedative, hypnotic or anxiolytic abuse, uncomplicated: Secondary | ICD-10-CM | POA: Insufficient documentation

## 2014-06-07 DIAGNOSIS — F3113 Bipolar disorder, current episode manic without psychotic features, severe: Secondary | ICD-10-CM | POA: Diagnosis present

## 2014-06-07 DIAGNOSIS — Z79899 Other long term (current) drug therapy: Secondary | ICD-10-CM | POA: Insufficient documentation

## 2014-06-07 DIAGNOSIS — R410 Disorientation, unspecified: Secondary | ICD-10-CM | POA: Diagnosis not present

## 2014-06-07 DIAGNOSIS — G479 Sleep disorder, unspecified: Secondary | ICD-10-CM | POA: Diagnosis not present

## 2014-06-07 DIAGNOSIS — F419 Anxiety disorder, unspecified: Secondary | ICD-10-CM | POA: Insufficient documentation

## 2014-06-07 DIAGNOSIS — Z85830 Personal history of malignant neoplasm of bone: Secondary | ICD-10-CM | POA: Insufficient documentation

## 2014-06-07 DIAGNOSIS — Z01818 Encounter for other preprocedural examination: Secondary | ICD-10-CM

## 2014-06-07 LAB — COMPREHENSIVE METABOLIC PANEL
ALT: 15 U/L (ref 14–54)
AST: 17 U/L (ref 15–41)
Albumin: 4.6 g/dL (ref 3.5–5.0)
Alkaline Phosphatase: 68 U/L (ref 38–126)
Anion gap: 9 (ref 5–15)
BUN: 12 mg/dL (ref 6–20)
CO2: 29 mmol/L (ref 22–32)
Calcium: 9.6 mg/dL (ref 8.9–10.3)
Chloride: 104 mmol/L (ref 101–111)
Creatinine, Ser: 0.7 mg/dL (ref 0.44–1.00)
GFR calc Af Amer: >60 mL/min (ref >60)
GFR calc non Af Amer: >60 mL/min (ref >60)
Glucose, Bld: 93 mg/dL (ref 70–99)
Potassium: 4.6 mmol/L (ref 3.5–5.1)
Sodium: 142 mmol/L (ref 135–145)
Total Bilirubin: 0.3 mg/dL (ref 0.3–1.2)
Total Protein: 6.9 g/dL (ref 6.5–8.1)

## 2014-06-07 LAB — RAPID URINE DRUG SCREEN, HOSP PERFORMED
Amphetamines: NOT DETECTED
Barbiturates: NOT DETECTED
Benzodiazepines: POSITIVE — AB
Cocaine: NOT DETECTED
Opiates: NOT DETECTED
Tetrahydrocannabinol: NOT DETECTED

## 2014-06-07 LAB — SALICYLATE LEVEL: Salicylate Lvl: <4 mg/dL (ref 2.8–30.0)

## 2014-06-07 LAB — CBC
HCT: 38.4 % (ref 36.0–46.0)
Hemoglobin: 12.6 g/dL (ref 12.0–15.0)
MCH: 31 pg (ref 26.0–34.0)
MCHC: 32.8 g/dL (ref 30.0–36.0)
MCV: 94.6 fL (ref 78.0–100.0)
Platelets: 216 10*3/uL (ref 150–400)
RBC: 4.06 MIL/uL (ref 3.87–5.11)
RDW: 13.6 % (ref 11.5–15.5)
WBC: 6.3 10*3/uL (ref 4.0–10.5)

## 2014-06-07 LAB — ACETAMINOPHEN LEVEL: Acetaminophen (Tylenol), Serum: <10 ug/mL — ABNORMAL LOW (ref 10–30)

## 2014-06-07 LAB — ETHANOL: Alcohol, Ethyl (B): <5 mg/dL (ref <5)

## 2014-06-07 MED ORDER — LORAZEPAM 1 MG PO TABS
1.0000 mg | ORAL_TABLET | Freq: Three times a day (TID) | ORAL | Status: DC | PRN
  Administered 2014-06-07: 1 mg via ORAL
  Filled 2014-06-07: qty 1

## 2014-06-07 MED ORDER — ALPRAZOLAM 0.5 MG PO TABS
1.0000 mg | ORAL_TABLET | Freq: Four times a day (QID) | ORAL | Status: DC | PRN
  Administered 2014-06-07 – 2014-06-08 (×2): 1 mg via ORAL
  Filled 2014-06-07 (×3): qty 2

## 2014-06-07 MED ORDER — ADULT MULTIVITAMIN W/MINERALS CH
1.0000 | ORAL_TABLET | Freq: Every day | ORAL | Status: DC
  Administered 2014-06-07 – 2014-06-08 (×2): 1 via ORAL
  Filled 2014-06-07 (×2): qty 1

## 2014-06-07 MED ORDER — IBUPROFEN 200 MG PO TABS: 600.0000 mg | ORAL_TABLET | Freq: Three times a day (TID) | ORAL | Status: DC | PRN

## 2014-06-07 MED ORDER — ACETAMINOPHEN 325 MG PO TABS
650.0000 mg | ORAL_TABLET | ORAL | Status: DC | PRN
  Administered 2014-06-07 – 2014-06-08 (×4): 650 mg via ORAL
  Filled 2014-06-07 (×4): qty 2

## 2014-06-07 MED ORDER — TRAZODONE HCL 50 MG PO TABS
50.0000 mg | ORAL_TABLET | Freq: Every day | ORAL | Status: DC
  Administered 2014-06-07: 50 mg via ORAL
  Filled 2014-06-07: qty 1

## 2014-06-07 MED ORDER — ATORVASTATIN CALCIUM 10 MG PO TABS
10.0000 mg | ORAL_TABLET | Freq: Every day | ORAL | Status: DC
  Administered 2014-06-08: 10 mg via ORAL
  Filled 2014-06-07: qty 1
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
## 2014-06-07 NOTE — ED Notes (Signed)
MD talking to patient.  I will collect labs when he finish.
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
## 2014-06-07 NOTE — ED Notes (Signed)
Bed: ZS82 Expected date:  Expected time:  Means of arrival:  Comments: Pt's still in the room

## 2014-06-07 NOTE — ED Notes (Signed)
Gold ring placed in clear biohazard bag and placed in pt's purse inside of the patient belongings bag.

## 2014-06-07 NOTE — ED Notes (Signed)
Pt resting quietly with eyes closed. Lying on her right side. Appears in no distress. Observed rise and fall of chest.
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
## 2014-06-08 ENCOUNTER — Emergency Department (HOSPITAL_COMMUNITY): Payer: Medicare Other

## 2014-06-08 DIAGNOSIS — F3113 Bipolar disorder, current episode manic without psychotic features, severe: Secondary | ICD-10-CM | POA: Diagnosis not present

## 2014-06-08 DIAGNOSIS — F309 Manic episode, unspecified: Secondary | ICD-10-CM | POA: Diagnosis not present

## 2014-06-08 DIAGNOSIS — Z01818 Encounter for other preprocedural examination: Secondary | ICD-10-CM | POA: Insufficient documentation

## 2014-06-08 MED ORDER — CHLORPROMAZINE HCL 25 MG PO TABS: 100.0000 mg | ORAL_TABLET | Freq: Every day | ORAL | Status: DC

## 2014-06-08 MED ORDER — TRAZODONE HCL 50 MG PO TABS
50.0000 mg | ORAL_TABLET | Freq: Every evening | ORAL | Status: DC | PRN
Start: 2014-06-08 — End: 2014-06-08

## 2014-06-08 MED ORDER — LORAZEPAM 0.5 MG PO TABS: 0.5000 mg | ORAL_TABLET | Freq: Four times a day (QID) | ORAL | Status: DC | PRN

## 2014-06-08 NOTE — Consult Note (Signed)
[place] Psychiatry Consult   Reason for Consult: Bipolar disorder, Manic Referring Physician:  EDP Patient Identification: Angela Ortiz MRN:  [id number] Principal Diagnosis: Bipolar affective disorder, manic, severe Diagnosis:   Patient Active Problem List   Diagnosis Date Noted  . Bipolar affective disorder, manic, severe [F31.13] 06/08/2014  . Anxiety [F41.9] 05/18/2014  . Panic disorder [F41.0] 05/18/2014  . Insomnia [G47.00] 05/18/2014    Total Time spent with patient: 1 hour  Subjective:   Angela Ortiz is a 64 y.o. female patient admitted with Manic Bipolar disorder.  HPI: Caucasian female, 64 years old was evaluated for symptoms of Bipolar manic disorder.  Patient was brought in by her husband who has concerns about patient neglecting her care and spending 10-12 hours a day on the Internet.   Patient was pleasant this morning and stated that she has been having manic symptoms for three month.  Patient reported that she has been cleaning her house at night and day and keeping her husband awake.  Patient reports that her sleep is poor and that she sleeps 4 hours.  She reported driving the wrong way last week  And have been getting lost on the [place].  Patient also reported experiencing visual hallucinations seeing mother cat moving her babies and the bashes opening and shifting for her.  Patient reports that she sees Dr Toy Care who prescribed Thorazine and Xanax for her.  Patient reported good sleep last night after taking Thorazine.  Patient reports she is going through tremendous stress because her two children have denied her seeing her grand children.  Patient denied SI/HI/AH.  Patient has been accepted at [facility] and will be transferred as soon as transportation is here.  HPI Elements:   Location:  Bipolar Manic, Visual hallucination.. Quality:  severe. Severity:  severe. Timing:  Acute. Duration:  Chronic mental illness. Context:  Seeking treatment for  manic symptoms..  Past Medical History:  Past Medical History  Diagnosis Date  . Bipolar disorder   . Anxiety   . 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 Right arm surgery    . Breast enhancement surgery    . Abdominal hysterectomy     Family History: History reviewed. No pertinent family history. Social History:  History  Alcohol Use  . 4.2 oz/week  . 7 Glasses of wine per week     History  Drug Use No    History   Social History  . Marital Status: Married    Spouse Name: N/A  . Number of Children: N/A  . Years of Education: N/A   Social History Main Topics  . Smoking status: Never Smoker   . Smokeless tobacco: Not on file  . Alcohol Use: 4.2 oz/week    7 Glasses of wine per week  . Drug Use: No  . Sexual Activity: Not on file   Other Topics Concern  . None   Social History Narrative   Additional Social History:    Pain Medications: SEE MAR Prescriptions: SEE MAR Over the Counter: SEE MAR History of alcohol / drug use?: No history of alcohol / drug abuse                     Allergies:  No Known Allergies  Labs:  Results for orders placed or performed during the hospital encounter of 06/07/14 (from the past 48 hour(s))  Acetaminophen level  Status: Abnormal   Collection Time: 06/07/14  3:49 PM  Result Value Ref Range   Acetaminophen (Tylenol), Serum <10 (L) 10 - 30 ug/mL    Comment:        THERAPEUTIC CONCENTRATIONS VARY SIGNIFICANTLY. A RANGE OF 10-30 ug/mL MAY BE AN EFFECTIVE CONCENTRATION FOR MANY PATIENTS. HOWEVER, SOME ARE BEST TREATED AT CONCENTRATIONS OUTSIDE THIS RANGE. ACETAMINOPHEN CONCENTRATIONS >150 ug/mL AT 4 HOURS AFTER INGESTION AND >50 ug/mL AT 12 HOURS AFTER INGESTION ARE OFTEN ASSOCIATED WITH TOXIC REACTIONS.   CBC     Status: None   Collection Time: 06/07/14  3:49 PM  Result Value Ref Range   WBC 6.3 4.0 - 10.5 K/uL   RBC 4.06 3.87 - 5.11 MIL/uL    Hemoglobin 12.6 12.0 - 15.0 g/dL   HCT 38.4 36.0 - 46.0 %   MCV 94.6 78.0 - 100.0 fL   MCH 31.0 26.0 - 34.0 pg   MCHC 32.8 30.0 - 36.0 g/dL   RDW 13.6 11.5 - 15.5 %   Platelets 216 150 - 400 K/uL  Comprehensive metabolic panel     Status: None   Collection Time: 06/07/14  3:49 PM  Result Value Ref Range   Sodium 142 135 - 145 mmol/L   Potassium 4.6 3.5 - 5.1 mmol/L   Chloride 104 101 - 111 mmol/L   CO2 29 22 - 32 mmol/L   Glucose, Bld 93 70 - 99 mg/dL   BUN 12 6 - 20 mg/dL   Creatinine, Ser 0.70 0.44 - 1.00 mg/dL   Calcium 9.6 8.9 - 10.3 mg/dL   Total Protein 6.9 6.5 - 8.1 g/dL   Albumin 4.6 3.5 - 5.0 g/dL   AST 17 15 - 41 U/L   ALT 15 14 - 54 U/L   Alkaline Phosphatase 68 38 - 126 U/L   Total Bilirubin 0.3 0.3 - 1.2 mg/dL   GFR calc non Af Amer >60 >60 mL/min   GFR calc Af Amer >60 >60 mL/min    Comment: (NOTE) The eGFR has been calculated using the CKD EPI equation. This calculation has not been validated in all clinical situations. eGFR's persistently <90 mL/min signify possible Chronic Kidney Disease.    Anion gap 9 5 - 15  Ethanol (ETOH)     Status: None   Collection Time: 06/07/14  3:49 PM  Result Value Ref Range   Alcohol, Ethyl (B) <5 <5 mg/dL    Comment:        LOWEST DETECTABLE LIMIT FOR SERUM ALCOHOL IS 11 mg/dL FOR MEDICAL PURPOSES ONLY   Salicylate level     Status: None   Collection Time: 06/07/14  3:49 PM  Result Value Ref Range   Salicylate Lvl <0.2 2.8 - 30.0 mg/dL  Urine Drug Screen     Status: Abnormal   Collection Time: 06/07/14  4:13 PM  Result Value Ref Range   Opiates NONE DETECTED NONE DETECTED   Cocaine NONE DETECTED NONE DETECTED   Benzodiazepines POSITIVE (A) NONE DETECTED   Amphetamines NONE DETECTED NONE DETECTED   Tetrahydrocannabinol NONE DETECTED NONE DETECTED   Barbiturates NONE DETECTED NONE DETECTED    Comment:        DRUG SCREEN FOR MEDICAL PURPOSES ONLY.  IF CONFIRMATION IS NEEDED FOR ANY PURPOSE, NOTIFY LAB WITHIN 5  DAYS.        LOWEST DETECTABLE LIMITS FOR URINE DRUG SCREEN Drug Class       Cutoff (ng/mL) Amphetamine      1000 Barbiturate  200 Benzodiazepine   341 Tricyclics       962 Opiates          300 Cocaine          300 THC              50     Vitals: Blood pressure 120/67, pulse 70, temperature 98.1 F (36.7 C), temperature source Oral, resp. rate 18, SpO2 100 %.  Risk to Self: Suicidal Ideation: No Suicidal Intent: No Is patient at risk for suicide?: No Suicidal Plan?: No Access to Means: No What has been your use of drugs/alcohol within the last 12 months?:  (patient denies ) How many times?:  (0) Other Self Harm Risks:  (n/a) Triggers for Past Attempts: None known Intentional Self Injurious Behavior: None Risk to Others: Homicidal Ideation: No Thoughts of Harm to Others: No Current Homicidal Intent: No Current Homicidal Plan: No Access to Homicidal Means: No Identified Victim:  (n/a) History of harm to others?: No Assessment of Violence: None Noted Violent Behavior Description:  (Patient calm and cooperative ) Does patient have access to weapons?: No Criminal Charges Pending?: No Does patient have a court date: No Prior Inpatient Therapy: Prior Inpatient Therapy: Yes Prior Therapy Dates: 10 years ago Prior Therapy Facilty/Provider(s): [facility] Reason for Treatment: panic attacks Prior Outpatient Therapy: Prior Outpatient Therapy: Yes Prior Therapy Dates: 10 years ago Prior Therapy Facilty/Provider(s): Dr. Toy Care Reason for Treatment: med management Does patient have an ACCT team?: No Does patient have Intensive In-House Services?  : No Does patient have [facility] services? : No Does patient have P4[facility] services?: No  Current Facility-Administered Medications  Medication Dose Route Frequency Provider Last Rate Last Dose  . acetaminophen (TYLENOL) tablet 650 mg  650 mg Oral Q4H PRN Wandra Arthurs, MD   650 mg at 06/08/14 0544  . atorvastatin (LIPITOR) tablet 10 mg  10 mg  Oral q1800 Wandra Arthurs, MD      . chlorproMAZINE (THORAZINE) tablet 100 mg  100 mg Oral QHS Esthefany Herrig      . ibuprofen (ADVIL,MOTRIN) tablet 600 mg  600 mg Oral Q8H PRN Wandra Arthurs, MD      . LORazepam (ATIVAN) tablet 0.5 mg  0.5 mg Oral Q6H PRN Melisssa Donner      . multivitamin with minerals tablet 1 tablet  1 tablet Oral Daily Wandra Arthurs, MD   1 tablet at 06/08/14 [id number]  . traZODone (DESYREL) tablet 50 mg  50 mg Oral QHS PRN Opie Fanton       Current Outpatient Prescriptions  Medication Sig Dispense Refill  . ALPRAZolam (XANAX) 1 MG tablet Take 1 mg by mouth 4 (four) times daily as needed for anxiety.     Marland Kitchen atorvastatin (LIPITOR) 10 MG tablet Take 10 mg by mouth daily.    Marland Kitchen BIOTIN PO Take 1 tablet by mouth daily.    . calcium carbonate (OS-CAL) 600 MG TABS tablet Take 1,200 mg by mouth daily with breakfast.    . chlorproMAZINE (THORAZINE) 100 MG tablet Take 100 mg by mouth at bedtime.    . Cholecalciferol (VITAMIN D PO) Take 1 tablet by mouth daily.    . diphenhydrAMINE (BENADRYL) 25 MG tablet Take 25-50 mg by mouth 2 (two) times daily as needed for allergies.    Marland Kitchen ibuprofen (ADVIL,MOTRIN) 200 MG tablet Take 400 mg by mouth every 4 (four) hours as needed for moderate pain.    Marland Kitchen LYSINE PO Take 2 tablets by mouth  daily.    . Multiple Vitamin (MULTIVITAMIN WITH MINERALS) TABS tablet Take 1 tablet by mouth daily.    . pneumococcal 13-valent conjugate vaccine (PREVNAR 13) SUSP injection Inject 0.5 mLs into the muscle once.    . traMADol (ULTRAM) 50 MG tablet Take 50 mg by mouth 3 (three) times daily as needed for moderate pain.     . traZODone (DESYREL) 50 MG tablet Take 50 mg by mouth at bedtime.     ROS is negative for all systems reviewed.  Musculoskeletal: Strength & Muscle Tone: within normal limits and was seen in bed lying down Gait & Station: lying in bed Patient leans: lying in bed  Psychiatric Specialty Exam:     Blood pressure 120/67, pulse 70, temperature 98.1 F  (36.7 C), temperature source Oral, resp. rate 18, SpO2 100 %.There is no weight on file to calculate BMI.  General Appearance: Casual and Fairly Groomed  Engineer, water::  Good  Speech:  Clear and Coherent and Pressured  Volume:  Normal  Mood:  Anxious and Euphoric  Affect:  Congruent  Thought Process:  Coherent, Goal Directed and Intact  Orientation:  Full (Time, Place, and Person)  Thought Content:  Hallucinations: Visual  Suicidal Thoughts:  No  Homicidal Thoughts:  No  Memory:  Immediate;   Good Recent;   Good Remote;   Good  Judgement:  Fair  Insight:  Good  Psychomotor Activity:  Normal  Concentration:  Good  Recall:  NA  Fund of Knowledge:Fair  Language: Good  Akathisia:  NA  Handed:  Right  AIMS (if indicated):     Assets:  Desire for Improvement  ADL's:  Intact  Cognition: WNL  Sleep:      Medical Decision Making: Review of Psycho-Social Stressors (1), Established Problem, Worsening (2), Review of Medication Regimen & Side Effects (2) and Review of New Medication or Change in Dosage (2)  Treatment Plan Summary: Daily contact with patient to assess and evaluate symptoms and progress in treatment, Medication management and Plan We will transfer patient to [facility] as soon as transportation here, Chest xray for preadmission was ordered and done.  Ativan 0.5 mg are prescribed for anxiety pending her transportation.  Plan:  Recommend psychiatric Inpatient admission when medically cleared. Accepted at [facility]. Disposition: see above  Delfin Gant     PMHNP-BC  06/08/2014 2:35 PM Patient seen face-to-face for psychiatric evaluation, chart reviewed and case discussed with the physician extender and developed treatment plan. Reviewed the information documented and agree with the treatment plan. Corena Pilgrim, MD

## 2014-06-08 NOTE — ED Notes (Signed)
Upon entering room pt sleeping. Per night shift RN pt has not slept throughout the night. Will allow pt to rest at present time. A shift assessment to be done at later time.

## 2014-06-08 NOTE — ED Notes (Signed)
Pt request new toothbrush, paste, and mouth wash. Pt continues to reports needs VD and calcium as takes at home. Psychiatrist aware.

## 2014-06-08 NOTE — BH Assessment (Signed)
[place] Assessment Progress Note  [name] called from [facility] to report that this pt has been accepted to their facility.  Accepting physician is Dr Nicholes Stairs.  Please call report to [phone].  [name] understands that pt is under voluntary status, and she agrees to fax a blank consent for admission form.  Pt has verbally agreed to sign form when it arrives.  [name], NP has been informed and she concurs with this decision.  Pt's nurse has also been notified.  As of this writing receipt of the form is pending.  Jalene Mullet, MA Triage Specialist [phone]

## 2014-06-08 NOTE — ED Notes (Signed)
Pt daily hygiene at present time.

## 2014-06-08 NOTE — ED Notes (Signed)
Pt using phone at present time.

## 2014-06-08 NOTE — ED Notes (Signed)
Pt continues to rest at present time.

## 2014-06-08 NOTE — ED Notes (Signed)
Pt performing arm exercises in bed at present time.

## 2014-06-08 NOTE — ED Notes (Signed)
Bed: WA31 Expected date:  Expected time:  Means of arrival:  Comments: 

## 2014-06-08 NOTE — BH Assessment (Signed)
[facility] Assessment Progress Note  The following facilities have been contacted to seek placement for this pt, with results as noted:  Beds available, information faxed, decision pending:  [facility]  No beds available, but accepting referrals for future consideration:  [facility]  Left message on voice mail; awaiting call back:  [facility]  At capacity:  [facility]  Bettyjane Shenoy, MA Triage Specialist [phone]     

## 2014-06-08 NOTE — ED Notes (Signed)
Bed: WA32 Expected date:  Expected time:  Means of arrival:  Comments: 

## 2014-06-08 NOTE — ED Notes (Signed)
Dietary called pt can only have finger food not utensils...klj  Spoke to [place] in service response.

## 2014-06-14 DIAGNOSIS — R569 Unspecified convulsions: Secondary | ICD-10-CM | POA: Insufficient documentation

## 2015-01-05 ENCOUNTER — Encounter: Payer: Self-pay | Admitting: Internal Medicine

## 2017-04-01 ENCOUNTER — Other Ambulatory Visit: Payer: Self-pay | Admitting: Internal Medicine

## 2017-04-01 DIAGNOSIS — N644 Mastodynia: Secondary | ICD-10-CM

## 2017-04-04 ENCOUNTER — Other Ambulatory Visit: Payer: Self-pay | Admitting: Internal Medicine

## 2017-04-04 ENCOUNTER — Ambulatory Visit
Admission: RE | Admit: 2017-04-04 | Discharge: 2017-04-04 | Disposition: A | Discharge status: Home / Self Care | Payer: Medicare Other | Source: Ambulatory Visit | Attending: Internal Medicine | Admitting: Internal Medicine

## 2017-04-04 DIAGNOSIS — N644 Mastodynia: Secondary | ICD-10-CM

## 2017-04-07 ENCOUNTER — Other Ambulatory Visit: Payer: Self-pay | Admitting: Internal Medicine

## 2017-04-07 DIAGNOSIS — R928 Other abnormal and inconclusive findings on diagnostic imaging of breast: Secondary | ICD-10-CM

## 2017-04-07 DIAGNOSIS — N6452 Nipple discharge: Secondary | ICD-10-CM

## 2017-04-11 ENCOUNTER — Ambulatory Visit
Admission: RE | Admit: 2017-04-11 | Discharge: 2017-04-11 | Disposition: A | Discharge status: Home / Self Care | Payer: Medicare Other | Source: Ambulatory Visit | Attending: Internal Medicine | Admitting: Internal Medicine

## 2017-04-11 DIAGNOSIS — R928 Other abnormal and inconclusive findings on diagnostic imaging of breast: Secondary | ICD-10-CM

## 2017-04-11 DIAGNOSIS — N6452 Nipple discharge: Secondary | ICD-10-CM

## 2017-04-11 MED ORDER — GADOBENATE DIMEGLUMINE 529 MG/ML IV SOLN
15.0000 mL | Freq: Once | INTRAVENOUS | Status: AC | PRN
  Administered 2017-04-11: 15 mL via INTRAVENOUS

## 2017-04-17 DIAGNOSIS — M797 Fibromyalgia: Secondary | ICD-10-CM | POA: Insufficient documentation

## 2017-04-22 NOTE — H&P (Signed)
Subjective:     Patient ID: Angela Ortiz is a 67 y.o. female.  HPI  Referred by patient for consultation breast implant removal. C/o bilateral nipple discharge brownish, onset few years ago, intermittent. Over last year has had pain and hardness of breasts, feels rubbing on ribs. MMG/US 04/2017 normal. Subsequent MRI 3.11.19 demonstrated right intracapsular rupture implant, left intra and extracapsular rupture.   No family history breast cancer.  History dual plane augmentation 32 years ago in [facility]. Prior to augmentation B cup, post augmentation "C+", current D cup.  PMH includes bipolar - managed by Psychiatrist-Dr. Marquis Lunch. Sister is RN with [employer]. Lives with husband.  Review of Systems  Constitutional: Positive for fatigue.  Musculoskeletal: Positive for back pain and myalgias.  Neurological: Positive for dizziness and light-headedness.  Psychiatric/Behavioral: Positive for dysphoric mood and sleep disturbance. The patient is nervous/anxious.    Remainder 12 point review negative    Objective:   Physical Exam  Constitutional: She is oriented to person, place, and time.  Cardiovascular: Normal rate, regular rhythm and normal heart sounds.   Pulmonary/Chest: Effort normal and breath sounds normal.  Neurological: She is alert and oriented to person, place, and time.  right >left volume Baker 4 bilateral Grade 3 ptosis right with waterfall over implant, left grade 1 ptosis Left breast at 2 o clock <cm nodular area Soft tissue pinch over implants right 10 cm L 8 cm SN to nipple R 27 L 24.5 cm BW R 18 L 17 cm Nipple to IMF R 9 L 9 cm bilat IMF scar fine line faded    Assessment:     Bilateral breast implant rupture (right intracapsular, left extracapsular) Hx augmentation mammaplasty Capsular contracture Nipple discharge    Plan:     Bilateral rupture, recommend removal with capsulectomies and excision of any palpable extracapsular silicone granulomas on  left. She does not desire any implant replacement. Reviewed OP surgery, drains, use of IMF scar that will need to be longer in length. Cannot assure her that all silicone will be removed and may have residual that is evident on future imaging. She has significant asymmetry breasts baseline and this will not change with implant removal.  Discussed additional surgeries such as mastopexy- patient not interested in this. Reviewed if desired can pursue in future.   Irene Limbo, MD [facility] [phone], pin [phone]

## 2017-05-01 ENCOUNTER — Encounter (HOSPITAL_BASED_OUTPATIENT_CLINIC_OR_DEPARTMENT_OTHER): Payer: Self-pay | Admitting: *Deleted

## 2017-05-07 DIAGNOSIS — H2513 Age-related nuclear cataract, bilateral: Secondary | ICD-10-CM | POA: Insufficient documentation

## 2017-05-07 DIAGNOSIS — H43393 Other vitreous opacities, bilateral: Secondary | ICD-10-CM | POA: Insufficient documentation

## 2017-05-07 NOTE — Pre-Procedure Instructions (Signed)
Pt given Ensure and instructed to drink by 0415 day of surgery with teach back method.

## 2017-05-09 ENCOUNTER — Ambulatory Visit (HOSPITAL_BASED_OUTPATIENT_CLINIC_OR_DEPARTMENT_OTHER): Payer: Medicare Other | Admitting: Anesthesiology

## 2017-05-09 ENCOUNTER — Encounter (HOSPITAL_BASED_OUTPATIENT_CLINIC_OR_DEPARTMENT_OTHER): Payer: Self-pay | Admitting: *Deleted

## 2017-05-09 ENCOUNTER — Ambulatory Visit (HOSPITAL_BASED_OUTPATIENT_CLINIC_OR_DEPARTMENT_OTHER)
Admission: RE | Admit: 2017-05-09 | Discharge: 2017-05-09 | Disposition: A | Discharge status: Home / Self Care | Payer: Medicare Other | Source: Ambulatory Visit | Attending: Plastic Surgery | Admitting: Plastic Surgery

## 2017-05-09 ENCOUNTER — Other Ambulatory Visit: Payer: Self-pay

## 2017-05-09 ENCOUNTER — Encounter (HOSPITAL_BASED_OUTPATIENT_CLINIC_OR_DEPARTMENT_OTHER)
Admission: RE | Disposition: A | Discharge status: Home / Self Care | Payer: Self-pay | Source: Ambulatory Visit | Attending: Plastic Surgery

## 2017-05-09 DIAGNOSIS — M791 Myalgia, unspecified site: Secondary | ICD-10-CM | POA: Insufficient documentation

## 2017-05-09 DIAGNOSIS — F419 Anxiety disorder, unspecified: Secondary | ICD-10-CM | POA: Diagnosis not present

## 2017-05-09 DIAGNOSIS — M549 Dorsalgia, unspecified: Secondary | ICD-10-CM | POA: Insufficient documentation

## 2017-05-09 DIAGNOSIS — X58XXXA Exposure to other specified factors, initial encounter: Secondary | ICD-10-CM | POA: Insufficient documentation

## 2017-05-09 DIAGNOSIS — T8549XA Other mechanical complication of breast prosthesis and implant, initial encounter: Secondary | ICD-10-CM | POA: Insufficient documentation

## 2017-05-09 DIAGNOSIS — T85898A Other specified complication of other internal prosthetic devices, implants and grafts, initial encounter: Secondary | ICD-10-CM | POA: Diagnosis present

## 2017-05-09 DIAGNOSIS — R42 Dizziness and giddiness: Secondary | ICD-10-CM | POA: Insufficient documentation

## 2017-05-09 DIAGNOSIS — F319 Bipolar disorder, unspecified: Secondary | ICD-10-CM | POA: Insufficient documentation

## 2017-05-09 DIAGNOSIS — R5383 Other fatigue: Secondary | ICD-10-CM | POA: Insufficient documentation

## 2017-05-09 HISTORY — PX: CAPSULECTOMY: SHX5381

## 2017-05-09 HISTORY — PX: BREAST IMPLANT REMOVAL: SHX5361

## 2017-05-09 SURGERY — REMOVAL, IMPLANT, BREAST
Anesthesia: General | Laterality: Left

## 2017-05-09 MED ORDER — ACETAMINOPHEN 500 MG PO TABS
1000.0000 mg | ORAL_TABLET | ORAL | Status: AC
  Administered 2017-05-09: 1000 mg via ORAL

## 2017-05-09 MED ORDER — HYDROMORPHONE HCL 1 MG/ML IJ SOLN
INTRAMUSCULAR | Status: AC
  Filled 2017-05-09: qty 0.5

## 2017-05-09 MED ORDER — CEFAZOLIN SODIUM-DEXTROSE 2-4 GM/100ML-% IV SOLN
INTRAVENOUS | Status: AC
  Filled 2017-05-09: qty 100

## 2017-05-09 MED ORDER — CHLORHEXIDINE GLUCONATE CLOTH 2 % EX PADS: 6.0000 | MEDICATED_PAD | Freq: Once | CUTANEOUS | Status: DC

## 2017-05-09 MED ORDER — FENTANYL CITRATE (PF) 100 MCG/2ML IJ SOLN: 50.0000 ug | INTRAMUSCULAR | Status: DC | PRN

## 2017-05-09 MED ORDER — OXYCODONE-ACETAMINOPHEN 5-325 MG PO TABS: 1.0000 | ORAL_TABLET | ORAL | 0 refills | Status: AC | PRN

## 2017-05-09 MED ORDER — SCOPOLAMINE 1 MG/3DAYS TD PT72: 1.0000 | MEDICATED_PATCH | Freq: Once | TRANSDERMAL | Status: DC | PRN

## 2017-05-09 MED ORDER — BUPIVACAINE HCL (PF) 0.25 % IJ SOLN
INTRAMUSCULAR | Status: AC
Start: 2017-05-09 — End: 2017-05-09
  Filled 2017-05-09: qty 60

## 2017-05-09 MED ORDER — OXYCODONE HCL 5 MG PO TABS
5.0000 mg | ORAL_TABLET | Freq: Once | ORAL | Status: AC
  Administered 2017-05-09: 5 mg via ORAL

## 2017-05-09 MED ORDER — OXYCODONE HCL 5 MG PO TABS
ORAL_TABLET | ORAL | Status: AC
  Filled 2017-05-09: qty 1

## 2017-05-09 MED ORDER — MEPERIDINE HCL 25 MG/ML IJ SOLN: 6.2500 mg | INTRAMUSCULAR | Status: DC | PRN

## 2017-05-09 MED ORDER — PROPOFOL 10 MG/ML IV BOLUS
INTRAVENOUS | Status: DC | PRN
  Administered 2017-05-09: 150 mg via INTRAVENOUS

## 2017-05-09 MED ORDER — BUPIVACAINE LIPOSOME 1.3 % IJ SUSP
INTRAMUSCULAR | Status: DC | PRN
  Administered 2017-05-09 (×2): 20 mL

## 2017-05-09 MED ORDER — EPHEDRINE SULFATE-NACL 50-0.9 MG/10ML-% IV SOSY
PREFILLED_SYRINGE | INTRAVENOUS | Status: DC | PRN
  Administered 2017-05-09: 5 mg via INTRAVENOUS
  Administered 2017-05-09 (×2): 10 mg via INTRAVENOUS

## 2017-05-09 MED ORDER — LACTATED RINGERS IV SOLN
INTRAVENOUS | Status: DC
  Administered 2017-05-09 (×2): via INTRAVENOUS

## 2017-05-09 MED ORDER — HYDROMORPHONE HCL 1 MG/ML IJ SOLN
0.2500 mg | INTRAMUSCULAR | Status: DC | PRN
  Administered 2017-05-09 (×4): 0.5 mg via INTRAVENOUS

## 2017-05-09 MED ORDER — DEXAMETHASONE SODIUM PHOSPHATE 10 MG/ML IJ SOLN
INTRAMUSCULAR | Status: DC | PRN
  Administered 2017-05-09: 10 mg via INTRAVENOUS

## 2017-05-09 MED ORDER — CEFAZOLIN SODIUM-DEXTROSE 2-4 GM/100ML-% IV SOLN
2.0000 g | INTRAVENOUS | Status: AC
  Administered 2017-05-09: 2 g via INTRAVENOUS

## 2017-05-09 MED ORDER — ACETAMINOPHEN 500 MG PO TABS
ORAL_TABLET | ORAL | Status: AC
  Filled 2017-05-09: qty 2

## 2017-05-09 MED ORDER — BUPIVACAINE LIPOSOME 1.3 % IJ SUSP
INTRAMUSCULAR | Status: AC
  Filled 2017-05-09: qty 20

## 2017-05-09 MED ORDER — LIDOCAINE 2% (20 MG/ML) 5 ML SYRINGE
INTRAMUSCULAR | Status: DC | PRN
  Administered 2017-05-09: 100 mg via INTRAVENOUS

## 2017-05-09 MED ORDER — CELECOXIB 200 MG PO CAPS
200.0000 mg | ORAL_CAPSULE | ORAL | Status: AC
  Administered 2017-05-09: 200 mg via ORAL

## 2017-05-09 MED ORDER — MIDAZOLAM HCL 2 MG/2ML IJ SOLN
INTRAMUSCULAR | Status: DC | PRN
  Administered 2017-05-09 (×2): 1 mg via INTRAVENOUS

## 2017-05-09 MED ORDER — GABAPENTIN 300 MG PO CAPS
300.0000 mg | ORAL_CAPSULE | ORAL | Status: AC
  Administered 2017-05-09: 300 mg via ORAL

## 2017-05-09 MED ORDER — ROCURONIUM BROMIDE 10 MG/ML (PF) SYRINGE
PREFILLED_SYRINGE | INTRAVENOUS | Status: DC | PRN
  Administered 2017-05-09: 50 mg via INTRAVENOUS

## 2017-05-09 MED ORDER — CELECOXIB 200 MG PO CAPS
ORAL_CAPSULE | ORAL | Status: AC
  Filled 2017-05-09: qty 1

## 2017-05-09 MED ORDER — SODIUM CHLORIDE 0.9 % IJ SOLN
INTRAMUSCULAR | Status: AC
  Filled 2017-05-09: qty 20

## 2017-05-09 MED ORDER — MIDAZOLAM HCL 2 MG/2ML IJ SOLN: 1.0000 mg | INTRAMUSCULAR | Status: DC | PRN

## 2017-05-09 MED ORDER — GABAPENTIN 300 MG PO CAPS
ORAL_CAPSULE | ORAL | Status: AC
Start: 2017-05-09 — End: 2017-05-09
  Filled 2017-05-09: qty 1

## 2017-05-09 MED ORDER — ONDANSETRON HCL 4 MG/2ML IJ SOLN
INTRAMUSCULAR | Status: DC | PRN
  Administered 2017-05-09: 4 mg via INTRAVENOUS

## 2017-05-09 MED ORDER — ONDANSETRON HCL 4 MG/2ML IJ SOLN: 4.0000 mg | Freq: Once | INTRAMUSCULAR | Status: DC | PRN

## 2017-05-09 MED ORDER — BUPIVACAINE-EPINEPHRINE (PF) 0.25% -1:200000 IJ SOLN
INTRAMUSCULAR | Status: AC
  Filled 2017-05-09: qty 30

## 2017-05-09 MED ORDER — FENTANYL CITRATE (PF) 100 MCG/2ML IJ SOLN
INTRAMUSCULAR | Status: DC | PRN
  Administered 2017-05-09 (×6): 50 ug via INTRAVENOUS

## 2017-05-09 MED ORDER — SUGAMMADEX SODIUM 200 MG/2ML IV SOLN
INTRAVENOUS | Status: DC | PRN
  Administered 2017-05-09: 175 mg via INTRAVENOUS

## 2017-05-09 SURGICAL SUPPLY — 70 items
BAG DECANTER FOR FLEXI CONT (MISCELLANEOUS) IMPLANT
BINDER BREAST LRG (GAUZE/BANDAGES/DRESSINGS) IMPLANT
BINDER BREAST MEDIUM (GAUZE/BANDAGES/DRESSINGS) IMPLANT
BINDER BREAST XLRG (GAUZE/BANDAGES/DRESSINGS) ×3 IMPLANT
BINDER BREAST XXLRG (GAUZE/BANDAGES/DRESSINGS) IMPLANT
BLADE SURG 10 STRL SS (BLADE) ×3 IMPLANT
BLADE SURG 15 STRL LF DISP TIS (BLADE) ×2 IMPLANT
BLADE SURG 15 STRL SS (BLADE) ×1
BNDG GAUZE ELAST 4 BULKY (GAUZE/BANDAGES/DRESSINGS) IMPLANT
CANISTER SUCT 1200ML W/VALVE (MISCELLANEOUS) ×3 IMPLANT
CHLORAPREP W/TINT 26ML (MISCELLANEOUS) ×3 IMPLANT
COVER BACK TABLE 60X90IN (DRAPES) ×3 IMPLANT
COVER MAYO STAND STRL (DRAPES) ×3 IMPLANT
DECANTER SPIKE VIAL GLASS SM (MISCELLANEOUS) IMPLANT
DERMABOND ADVANCED (GAUZE/BANDAGES/DRESSINGS) ×2
DERMABOND ADVANCED .7 DNX12 (GAUZE/BANDAGES/DRESSINGS) ×4 IMPLANT
DRAIN CHANNEL 15F RND FF W/TCR (WOUND CARE) ×6 IMPLANT
DRAPE INCISE IOBAN 66X45 STRL (DRAPES) IMPLANT
DRAPE TOP ARMCOVERS (MISCELLANEOUS) ×3 IMPLANT
DRAPE U-SHAPE 76X120 STRL (DRAPES) ×3 IMPLANT
DRSG PAD ABDOMINAL 8X10 ST (GAUZE/BANDAGES/DRESSINGS) ×6 IMPLANT
ELECT BLADE 4.0 EZ CLEAN MEGAD (MISCELLANEOUS) ×3
ELECT BLADE 6.5 .24CM SHAFT (ELECTRODE) IMPLANT
ELECT COATED BLADE 2.86 ST (ELECTRODE) ×3 IMPLANT
ELECT REM PT RETURN 9FT ADLT (ELECTROSURGICAL) ×3
ELECTRODE BLDE 4.0 EZ CLN MEGD (MISCELLANEOUS) ×2 IMPLANT
ELECTRODE REM PT RTRN 9FT ADLT (ELECTROSURGICAL) ×2 IMPLANT
EVACUATOR SILICONE 100CC (DRAIN) ×6 IMPLANT
GAUZE SPONGE 4X4 12PLY STRL (GAUZE/BANDAGES/DRESSINGS) IMPLANT
GAUZE SPONGE 4X4 12PLY STRL LF (GAUZE/BANDAGES/DRESSINGS) IMPLANT
GLOVE BIO SURGEON STRL SZ 6 (GLOVE) ×3 IMPLANT
GLOVE BIO SURGEON STRL SZ 6.5 (GLOVE) IMPLANT
GLOVE BIOGEL PI IND STRL 7.0 (GLOVE) ×4 IMPLANT
GLOVE BIOGEL PI IND STRL 7.5 (GLOVE) ×2 IMPLANT
GLOVE BIOGEL PI INDICATOR 7.0 (GLOVE) ×2
GLOVE BIOGEL PI INDICATOR 7.5 (GLOVE) ×1
GLOVE SURG SS PI 7.0 STRL IVOR (GLOVE) ×3 IMPLANT
GLOVE SURG SYN 7.5  E (GLOVE) ×1
GLOVE SURG SYN 7.5 E (GLOVE) ×2 IMPLANT
GOWN STRL REUS W/ TWL LRG LVL3 (GOWN DISPOSABLE) ×4 IMPLANT
GOWN STRL REUS W/ TWL XL LVL3 (GOWN DISPOSABLE) ×2 IMPLANT
GOWN STRL REUS W/TWL LRG LVL3 (GOWN DISPOSABLE) ×2
GOWN STRL REUS W/TWL XL LVL3 (GOWN DISPOSABLE) ×1
MARKER SKIN DUAL TIP RULER LAB (MISCELLANEOUS) IMPLANT
NEEDLE HYPO 25X1 1.5 SAFETY (NEEDLE) ×3 IMPLANT
NS IRRIG 1000ML POUR BTL (IV SOLUTION) ×3 IMPLANT
PACK BASIN DAY SURGERY FS (CUSTOM PROCEDURE TRAY) ×3 IMPLANT
PENCIL BUTTON HOLSTER BLD 10FT (ELECTRODE) ×3 IMPLANT
PIN SAFETY STERILE (MISCELLANEOUS) ×3 IMPLANT
SHEET MEDIUM DRAPE 40X70 STRL (DRAPES) ×6 IMPLANT
SLEEVE SCD COMPRESS KNEE MED (MISCELLANEOUS) ×3 IMPLANT
SPONGE LAP 18X18 RF (DISPOSABLE) ×6 IMPLANT
STAPLER VISISTAT 35W (STAPLE) ×3 IMPLANT
STRIP CLOSURE SKIN 1/2X4 (GAUZE/BANDAGES/DRESSINGS) IMPLANT
SUT ETHILON 2 0 FS 18 (SUTURE) ×6 IMPLANT
SUT MNCRL AB 4-0 PS2 18 (SUTURE) ×6 IMPLANT
SUT VIC AB 3-0 PS1 18 (SUTURE) ×2
SUT VIC AB 3-0 PS1 18XBRD (SUTURE) ×4 IMPLANT
SUT VIC AB 3-0 SH 27 (SUTURE)
SUT VIC AB 3-0 SH 27X BRD (SUTURE) IMPLANT
SUT VICRYL 4-0 PS2 18IN ABS (SUTURE) ×6 IMPLANT
SWAB COLLECTION DEVICE MRSA (MISCELLANEOUS) IMPLANT
SWAB CULTURE ESWAB REG 1ML (MISCELLANEOUS) IMPLANT
SYR 50ML LL SCALE MARK (SYRINGE) IMPLANT
SYR BULB IRRIGATION 50ML (SYRINGE) ×3 IMPLANT
SYR CONTROL 10ML LL (SYRINGE) ×3 IMPLANT
TOWEL OR 17X24 6PK STRL BLUE (TOWEL DISPOSABLE) ×6 IMPLANT
TUBE CONNECTING 20X1/4 (TUBING) ×6 IMPLANT
UNDERPAD 30X30 (UNDERPADS AND DIAPERS) ×6 IMPLANT
YANKAUER SUCT BULB TIP NO VENT (SUCTIONS) ×3 IMPLANT

## 2017-05-09 NOTE — Op Note (Signed)
Operative Note   DATE OF OPERATION: 4.5.2019  LOCATION: [facility]-outpatient  SURGICAL DIVISION: Plastic Surgery  PREOPERATIVE DIAGNOSES:  1. History augmentation mammaplasty 2. Bilateral capsular contracture 3. Bilateral implant rupture  POSTOPERATIVE DIAGNOSES:  1. History augmentation mammaplasty 2. Bilateral capsular contracture 3. Left extracapsular implant rupture  PROCEDURE:  1. Right removal breast implant 2. Bilateral capsulectomies 3. Left removal implant material  SURGEON: Irene Limbo MD MBA  ASSISTANT: none  ANESTHESIA:  General.   EBL: 50 ml  COMPLICATIONS: None immediate.   INDICATIONS FOR PROCEDURE:  The patient, Angela Ortiz, is a 67 y.o. female born on [birth year], is here for removal bilateral implants. She has a history prior augmentation with silicone. Clinically she has bilateral pain and capsular contracture. MRI read as right intracapsular and left extracapsular rupture.   FINDINGS: 1. Bilateral capsular contracture with thick capsule with calcification, no masses. 2. Removed intact right smooth round silicone implant, no implant stamp, gel bleed present. 3. Grossly ruptured extracapsular left breast implant. 4. Implants in subglandular position.  DESCRIPTION OF PROCEDURE:  The patient's operative site was marked with the patient in the preoperative area. The patient was taken to the operating room. SCDs were placed and IV antibiotics were given. The patient's operative site was prepped and draped in a sterile fashion. A time out was performed and all information was confirmed to be correct. Incision made in right IMF scar and extended. Incision Carried through superficial fascia to implant capsule. Dissection completed around entirety of capsule, above pectoralis muscle and implant and capsule excised. Capsule entered and intact silicone implant with gel bleed noted. Cavity irrigated and hemostasis ensured. 15 Fr JP placed and secured with 2-0 nylon.  Exparel infiltrated. Closure completed with 3-0 vicryl in superficial fascia, 4-0 vicryl in dermis and 4-0 monocryl subcuticular. Tissue adhesive applied.  I then directed attention to left breast. Incision made in IMF scar and extended. Dissection completed around entirety of capsule, above pectoralis muscle and implant. Gross extracapsular rupture noted medially and laterally and dissection completed around this and developing granulomatous tissue. Capsule excised and free silicone removed. Cavity irrigated thoroughly with saline and hemostasis ensured. 15 Fr JP placed and secured with 2-0 nylon. Exparel infiltrated. Closure completed with 3-0 vicryl in superficial fascia, 4-0 vicryl in dermis and 4-0 monocryl subcuticular. Tissue adhesive applied.  The patient was allowed to wake from anesthesia, extubated and taken to the recovery room in satisfactory condition.   SPECIMENS: 1. Right implant and capsule 2. Left implant material and capsule  DRAINS: 15 Fr JP in right and left breast  Irene Limbo, MD [facility] Plastic & Reconstructive Surgery [phone], pin [phone]

## 2017-05-09 NOTE — Discharge Instructions (Signed)
°  Post Anesthesia Home Care Instructions  Activity: Get plenty of rest for the remainder of the day. A responsible individual must stay with you for 24 hours following the procedure.  For the next 24 hours, DO NOT: -Drive a car -Paediatric nurse -Drink alcoholic beverages -Take any medication unless instructed by your physician -Make any legal decisions or sign important papers.  Meals: Start with liquid foods such as gelatin or soup. Progress to regular foods as tolerated. Avoid greasy, spicy, heavy foods. If nausea and/or vomiting occur, drink only clear liquids until the nausea and/or vomiting subsides. Call your physician if vomiting continues.  Special Instructions/Symptoms: Your throat may feel dry or sore from the anesthesia or the breathing tube placed in your throat during surgery. If this causes discomfort, gargle with warm salt water. The discomfort should disappear within 24 hours.  If you had a scopolamine patch placed behind your ear for the management of post- operative nausea and/or vomiting:  1. The medication in the patch is effective for 72 hours, after which it should be removed.  Wrap patch in a tissue and discard in the trash. Wash hands thoroughly with soap and water. 2. You may remove the patch earlier than 72 hours if you experience unpleasant side effects which may include dry mouth, dizziness or visual disturbances. 3. Avoid touching the patch. Wash your hands with soap and water after contact with the patch.   Received Tylenol 1000mg  at 630AM!! Received Oxy 5mg  at 11AM!! NEXT DOSE PAIN MED AVAILABLE AT 3PM IF NEEDED .Marland KitchenMarland KitchenMarland Kitchen

## 2017-05-09 NOTE — Interval H&P Note (Signed)
History and Physical Interval Note:  05/09/2017 7:02 AM  Angela Ortiz  has presented today for surgery, with the diagnosis of HSITORY OF AUGMENTATION MAMMOPLASTY  The various methods of treatment have been discussed with the patient and family. After consideration of risks, benefits and other options for treatment, the patient has consented to  Procedure(s): BILATERAL  REMOVAL BREAST IMPLANTS (Bilateral) CAPSULECTOMY AND REMOVAL OF IMPLANT MATERIAL ON LEFT (Left) as a surgical intervention .  The patient's history has been reviewed, patient examined, no change in status, stable for surgery.  I have reviewed the patient's chart and labs.  Questions were answered to the patient's satisfaction.     Kyros Salzwedel

## 2017-05-09 NOTE — Anesthesia Postprocedure Evaluation (Signed)
Anesthesia Post Note  Patient: Evanell Redlich  Procedure(s) Performed: BILATERAL  REMOVAL BREAST IMPLANTS (Bilateral ) CAPSULECTOMY AND REMOVAL OF IMPLANT MATERIAL ON LEFT (Left )     Patient location during evaluation: PACU Anesthesia Type: General Level of consciousness: awake and alert Pain management: pain level controlled Vital Signs Assessment: post-procedure vital signs reviewed and stable Respiratory status: spontaneous breathing, nonlabored ventilation, respiratory function stable and patient connected to nasal cannula oxygen Cardiovascular status: blood pressure returned to baseline and stable Postop Assessment: no apparent nausea or vomiting Anesthetic complications: no    Last Vitals:  Vitals:   05/09/17 1015 05/09/17 1030  BP: (!) 123/58 134/63  Pulse: 70 77  Resp: (!) 26 (!) 25  Temp:    SpO2: 100% 100%    Last Pain:  Vitals:   05/09/17 1030  TempSrc:   PainSc: 8                  Ladislaus Repsher DAVID

## 2017-05-09 NOTE — Anesthesia Procedure Notes (Signed)
Procedure Name: Intubation Date/Time: 05/09/2017 7:39 AM Performed by: Cynda Familia, CRNA Pre-anesthesia Checklist: Patient identified, Emergency Drugs available, Suction available and Patient being monitored Patient Re-evaluated:Patient Re-evaluated prior to induction Oxygen Delivery Method: Circle System Utilized Preoxygenation: Pre-oxygenation with 100% oxygen Induction Type: IV induction Ventilation: Mask ventilation without difficulty Laryngoscope Size: Miller and 2 Grade View: Grade I Tube type: Oral Number of attempts: 1 Airway Equipment and Method: Stylet and Oral airway Placement Confirmation: ETT inserted through vocal cords under direct vision,  positive ETCO2 and breath sounds checked- equal and bilateral Secured at: 21 cm Tube secured with: Tape Dental Injury: Teeth and Oropharynx as per pre-operative assessment  Comments: Smooth IV induction Ossey-- intubation AM CRNA atraumatic--- teeth and mouth as preop-- bilat BS Ossey--

## 2017-05-09 NOTE — Transfer of Care (Signed)
Immediate Anesthesia Transfer of Care Note  Patient: Angela Ortiz  Procedure(s) Performed: BILATERAL  REMOVAL BREAST IMPLANTS (Bilateral ) CAPSULECTOMY AND REMOVAL OF IMPLANT MATERIAL ON LEFT (Left )  Patient Location: PACU  Anesthesia Type:General  Level of Consciousness: awake and alert   Airway & Oxygen Therapy: Patient Spontanous Breathing and Patient connected to face mask oxygen  Post-op Assessment: Report given to RN and Post -op Vital signs reviewed and stable  Post vital signs: Reviewed and stable  Last Vitals:  Vitals Value Taken Time  BP 138/74 05/09/2017  9:33 AM  Temp    Pulse 81 05/09/2017  9:35 AM  Resp 19 05/09/2017  9:35 AM  SpO2 100 % 05/09/2017  9:35 AM  Vitals shown include unvalidated device data.  Last Pain:  Vitals:   05/09/17 0630  TempSrc: Oral  PainSc: 6       Patients Stated Pain Goal: 3 (86/16/83 7290)  Complications: No apparent anesthesia complications

## 2017-05-09 NOTE — Anesthesia Procedure Notes (Signed)
Date/Time: 05/09/2017 9:20 AM Performed by: Cynda Familia, CRNA Oxygen Delivery Method: Simple face mask Placement Confirmation: positive ETCO2 and breath sounds checked- equal and bilateral Dental Injury: Teeth and Oropharynx as per pre-operative assessment

## 2017-05-09 NOTE — Anesthesia Preprocedure Evaluation (Addendum)
Anesthesia Evaluation  Patient identified by MRN, date of birth, ID band Patient awake    Reviewed: Allergy & Precautions, NPO status , Patient's Chart, lab work & pertinent test results  Airway Mallampati: I  TM Distance: >3 FB Neck ROM: Full    Dental  (+) Dental Advisory Given, Chipped   Pulmonary    Pulmonary exam normal        Cardiovascular Normal cardiovascular exam     Neuro/Psych Anxiety Bipolar Disorder    GI/Hepatic   Endo/Other    Renal/GU      Musculoskeletal   Abdominal   Peds  Hematology   Anesthesia Other Findings   Reproductive/Obstetrics                            Anesthesia Physical Anesthesia Plan  ASA: III  Anesthesia Plan: General   Post-op Pain Management:    Induction: Intravenous  PONV Risk Score and Plan: 3 and Ondansetron, Dexamethasone, Treatment may vary due to age or medical condition and Midazolam  Airway Management Planned: Oral ETT  Additional Equipment:   Intra-op Plan:   Post-operative Plan: Extubation in OR  Informed Consent: I have reviewed the patients History and Physical, chart, labs and discussed the procedure including the risks, benefits and alternatives for the proposed anesthesia with the patient or authorized representative who has indicated his/her understanding and acceptance.     Plan Discussed with: CRNA and Surgeon  Anesthesia Plan Comments:        Anesthesia Quick Evaluation

## 2017-05-12 ENCOUNTER — Encounter (HOSPITAL_BASED_OUTPATIENT_CLINIC_OR_DEPARTMENT_OTHER): Payer: Self-pay | Admitting: Plastic Surgery

## 2018-01-21 ENCOUNTER — Other Ambulatory Visit: Payer: Self-pay | Admitting: Family Medicine

## 2018-01-21 DIAGNOSIS — N644 Mastodynia: Secondary | ICD-10-CM

## 2018-01-21 DIAGNOSIS — R5381 Other malaise: Secondary | ICD-10-CM

## 2018-01-23 ENCOUNTER — Ambulatory Visit
Admission: RE | Admit: 2018-01-23 | Discharge: 2018-01-23 | Disposition: A | Discharge status: Home / Self Care | Payer: Medicare Other | Source: Ambulatory Visit | Attending: Family Medicine | Admitting: Family Medicine

## 2018-01-23 DIAGNOSIS — N644 Mastodynia: Secondary | ICD-10-CM

## 2018-03-31 ENCOUNTER — Other Ambulatory Visit: Payer: Self-pay | Admitting: Internal Medicine

## 2018-03-31 DIAGNOSIS — R0781 Pleurodynia: Secondary | ICD-10-CM

## 2018-04-10 ENCOUNTER — Ambulatory Visit
Admission: RE | Admit: 2018-04-10 | Discharge: 2018-04-10 | Disposition: A | Discharge status: Home / Self Care | Payer: Medicare Other | Source: Ambulatory Visit | Attending: Internal Medicine | Admitting: Internal Medicine

## 2018-04-10 ENCOUNTER — Other Ambulatory Visit: Payer: Medicare Other

## 2018-04-10 DIAGNOSIS — R0781 Pleurodynia: Secondary | ICD-10-CM

## 2018-04-10 MED ORDER — GADOBENATE DIMEGLUMINE 529 MG/ML IV SOLN
15.0000 mL | Freq: Once | INTRAVENOUS | Status: AC | PRN
  Administered 2018-04-10: 15 mL via INTRAVENOUS

## 2018-04-16 ENCOUNTER — Other Ambulatory Visit: Payer: Medicare Other

## 2018-09-07 ENCOUNTER — Other Ambulatory Visit: Payer: Self-pay

## 2018-12-30 ENCOUNTER — Other Ambulatory Visit: Payer: Self-pay

## 2019-04-20 ENCOUNTER — Encounter: Payer: Self-pay | Admitting: Gastroenterology

## 2019-04-20 ENCOUNTER — Other Ambulatory Visit: Payer: Self-pay | Admitting: Internal Medicine

## 2019-04-20 DIAGNOSIS — R109 Unspecified abdominal pain: Secondary | ICD-10-CM

## 2019-05-03 ENCOUNTER — Other Ambulatory Visit: Payer: Medicare Other

## 2019-06-16 ENCOUNTER — Encounter: Payer: Self-pay | Admitting: Gastroenterology

## 2019-07-22 ENCOUNTER — Ambulatory Visit (INDEPENDENT_AMBULATORY_CARE_PROVIDER_SITE_OTHER): Payer: Medicare Other | Admitting: Gastroenterology

## 2019-07-22 ENCOUNTER — Encounter: Payer: Self-pay | Admitting: Gastroenterology

## 2019-07-22 VITALS — BP 154/90 | HR 77 | Temp 97.5°F | Ht 66.5 in | Wt 161.1 lb

## 2019-07-22 DIAGNOSIS — R1031 Right lower quadrant pain: Secondary | ICD-10-CM | POA: Diagnosis not present

## 2019-07-22 DIAGNOSIS — R1013 Epigastric pain: Secondary | ICD-10-CM

## 2019-07-22 DIAGNOSIS — K5900 Constipation, unspecified: Secondary | ICD-10-CM

## 2019-07-22 MED ORDER — CLENPIQ 10-3.5-12 MG-GM -GM/160ML PO SOLN: 1.0000 | ORAL | 0 refills | Status: DC

## 2019-07-22 MED ORDER — OMEPRAZOLE 20 MG PO CPDR: 20.0000 mg | DELAYED_RELEASE_CAPSULE | Freq: Every day | ORAL | 6 refills | Status: AC

## 2019-07-22 NOTE — Patient Instructions (Addendum)
If you are age 71 or older, your body mass index should be between 23-30. Your Body mass index is 25.62 kg/m. If this is out of the aforementioned range listed, please consider follow up with your Primary Care Provider.  If you are age 15 or younger, your body mass index should be between 19-25. Your Body mass index is 25.62 kg/m. If this is out of the aformentioned range listed, please consider follow up with your Primary Care Provider.   You have been scheduled for an endoscopy and colonoscopy. Please follow the written instructions given to you at your visit today. Please pick up your prep supplies at the pharmacy within the next 1-3 days. If you use inhalers (even only as needed), please bring them with you on the day of your procedure. Your physician has requested that you go to www.startemmi.com and enter the access code given to you at your visit today. This web site gives a general overview about your procedure. However, you should still follow specific instructions given to you by our office regarding your preparation for the procedure.  We have sent the following medications to your pharmacy for you to pick up at your convenience: Omeprazole 20 mg daily  Miralax 17 gram  Twice daily until good bowel movement, then once daily. ( this is over-the-counter)  Your provider has requested that you go to the basement lab at [place], [place], [place]. Press "B" on the elevator. The lab is located at the first door on the left as you exit the elevator. HAVE LABS DRAWN THE BEFORE PROCEDURE THE DAY OF PROCEDURE.  We will contact you regarding an abominal ultrasound at [place] have been scheduled for an abdominal ultrasound at [facility] on        at     . Please arrive 15 minutes prior to your appointment for registration. Make certain not to have anything to eat or drink 6 hours prior to your appointment.   We are requesting a copy of CT scan.  We are requesting  records from Dr [facility] office.  Thank you for choosing me and [facility].  Jackquline Denmark, MD

## 2019-07-22 NOTE — Progress Notes (Signed)
Chief Complaint: GI problems.  Referring Provider:  Dr Helene Kelp      ASSESSMENT AND PLAN;   #1. RLQ pain with constipation.  She had constipation even before starting amlodipine.  Never had colonoscopy. Neg CT AP at [facility] -report awaited. Nl TSH  #2. Epi pain. Neg CT [facility] (report awaited)  #3. Associated comorbid conditions including bipolar disorder, fibromyalgia, HTN, HLD.   Plan: -CBC, CMP, Celiac and lipase. -CT report from [facility]. -Omeprazole 20mg  po qd. -EGD/colon (2 day prep).  Discussed risks & benefits extensively with the patient patient's husband. (Risks including rare perforation req laparotomy, bleeding after bx/polypectomy req blood transfusion, rarely missing neoplasms, risks of anesthesia/sedation). Benefits outweigh the risks. Patient agrees to proceed. All the questions were answered. Consent forms given for review. -[place] abdo complete. -Miralax 17g po bid until good good BMs, then QD -Please obtain records from Dr Waunita Schooner note, blood work.   HPI:    Angela Ortiz is a 69 y.o. female  Very pleasant, accompanied by her husband With H/O bipolar/anxiety/depression/fibromyalgia, HLD, HTN, H/O right ulnar chondrosarcoma s/p resection, chronic headaches, OA with multiple GI problems. No records available at this visit  - Epi pain with occasional nausea x 1-2 yrs, intermittent, with occasional heartburn, " spitting up blood and saliva".  At times gets worse after eating fatty and spicy foods.  Has strong family history of gallbladder problems including mother and sister.  She denies having any odynophagia or dysphagia.  Has taken Pepcid with minimal relief.  -Right lower quadrant abdominal pain x 1 to 2 years, associated with longstanding history of constipation, abdominal bloating. Has BMs 1/2 to 3 days.  Takes MiraLAX only intermittently.  MiraLAX does work.  Right lower quadrant abdominal pain does not get significantly relieved by defecation.  Not related  to walking.  She does have a hip osteoarthritis but that pain is different.  -Underwent CT scan Abdo/pelvis at [facility] which according to patient and patient's husband was negative.  We do not have those records.  -She has been advised to further GI work-up by means of endoscopic evaluation.  She never had colonoscopy performed.  Had EGD over 30 years ago which she does not remember much.  -Has good appetite.  No weight loss.  No jaundice dark urine or pale stools.  She does take Tylenol for osteoarthritis. No sodas, chocolates, chewing gums, artificial sweeteners and candy. No NSAIDs.  Does drink plenty of water.  -Per patient's husband, she gets TSH checked every 6 months since she is on lithium.  It has been normal.  -No jaundice dark urine or pale stools.   Past Medical History:  Diagnosis Date  . Anxiety   . Bipolar disorder ([place])   . 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 Chronic headache   . Depression   . Elevated cholesterol   . Fibromyalgia   . Hypertension     Past Surgical History:  Procedure Laterality Date  . ABDOMINAL HYSTERECTOMY    . AUGMENTATION MAMMAPLASTY Bilateral    removed april 2019  . BREAST ENHANCEMENT SURGERY    . BREAST IMPLANT REMOVAL Bilateral 05/09/2017   Procedure: BILATERAL  REMOVAL BREAST IMPLANTS;  Surgeon: Irene Limbo, MD;  Location: [facility];  Service: Plastics;  Laterality: Bilateral;  . CAPSULECTOMY Left 05/09/2017   Procedure: CAPSULECTOMY AND REMOVAL OF IMPLANT MATERIAL ON LEFT;  Surgeon: Irene Limbo, MD;  Location: [facility];  Service: Plastics;  Laterality: Left;  . ESOPHAGOGASTRODUODENOSCOPY     over 30 years ago  . explants Bilateral   . right arm surgery    . ULNA OSTEOTOMY  1992   partical    Family History  Problem Relation Age of Onset  . Diabetes Father   . Heart disease Father   . Breast cancer Neg Hx   . Colon cancer Neg Hx   . Esophageal cancer Neg Hx      Social History   Tobacco Use  . Smoking status: Never Smoker  . Smokeless tobacco: Never Used  Vaping Use  . Vaping Use: Never used  Substance Use Topics  . Alcohol use: Not Currently    Alcohol/week: 7.0 standard drinks    Types: 7 Glasses of wine per week  . Drug use: Never    Current Outpatient Medications  Medication Sig Dispense Refill  . Acetaminophen (TYLENOL PO) Take 400 mg by mouth 3 (three) times daily. Takes 2 200mg     . amantadine (SYMMETREL) 100 MG capsule Take 100 mg by mouth daily.    Marland Kitchen amLODipine (NORVASC) 5 MG tablet Take 5 mg by mouth daily.    Marland Kitchen atorvastatin (LIPITOR) 10 MG tablet Take 10 mg by mouth daily.    . famotidine (PEPCID) 20 MG tablet Take 20 mg by mouth 2 (two) times daily.    Marland Kitchen gabapentin (NEURONTIN) 600 MG tablet Take 600 mg by mouth at bedtime.    . hydrOXYzine (VISTARIL) 25 MG capsule Take 25 mg by mouth 3 (three) times daily.    Marland Kitchen levothyroxine (SYNTHROID) 25 MCG tablet Take 25 mcg by mouth daily before breakfast.    . LITHIUM CARBONATE PO Take 225 mg by mouth daily.    Marland Kitchen LORazepam (ATIVAN) 1 MG tablet Take 1 mg by mouth 3 (three) times daily.    . Multiple Vitamin (MULTIVITAMIN WITH MINERALS) TABS tablet Take 1 tablet by mouth daily.    . polyethylene glycol (MIRALAX / GLYCOLAX) 17 g packet Take 17 g by mouth as needed.    . vitamin B-12 (CYANOCOBALAMIN) 1000 MCG tablet Take 1,000 mcg by mouth daily.     No current facility-administered medications for this visit.    No Known Allergies  Review of Systems:  Constitutional: Denies fever, chills, diaphoresis, appetite change and has fatigue.  HEENT: Denies photophobia, eye pain, redness, hearing loss, ear pain, congestion, sore throat, rhinorrhea, sneezing, mouth sores, neck pain, neck stiffness and tinnitus.   Respiratory: Denies SOB, DOE, cough, chest tightness,  and wheezing.   Cardiovascular: Denies chest pain, palpitations and leg swelling.  Genitourinary: Denies dysuria, urgency,  frequency, hematuria, flank pain and difficulty urinating.  Musculoskeletal: Has myalgias, back pain, joint swelling, arthralgias and gait problem.  Skin: Has rash.  Neurological: Denies dizziness, seizures, syncope, weakness, light-headedness, numbness and has headaches.  Hematological: Denies adenopathy. Easy bruising, personal or family bleeding history  Psychiatric/Behavioral: Has anxiety or depression     Physical Exam:    BP (!) 154/90   Pulse 77   Temp (!) 97.5 F (36.4 C)   Ht 5' 6.5" (1.689 m)   Wt 161 lb 2 oz (73.1 kg)   BMI 25.62 kg/m  Wt Readings from Last 3 Encounters:  07/22/19 161 lb 2 oz (73.1 kg)  05/09/17 176 lb 6.4 oz (80 kg)  05/17/14 155 lb (70.3 kg)   Constitutional:  Well-developed, in no acute distress. Psychiatric: Normal mood and affect. Behavior is normal. HEENT: Pupils normal.  Conjunctivae are normal. No  scleral icterus. Neck supple.  Cardiovascular: Normal rate, regular rhythm. No edema Pulmonary/chest: Effort normal and breath sounds normal. No wheezing, rales or rhonchi. Abdominal: Soft, nondistended. Nontender. Bowel sounds active throughout. There are no masses palpable. No hepatomegaly. Rectal:  defered Neurological: Alert and oriented to person place and time. Skin: Skin is warm and dry. No rashes noted.  Data Reviewed: I have personally reviewed following labs and imaging studies  CBC: CBC Latest Ref Rng & Units 06/07/2014 05/17/2014  WBC 4.0 - 10.5 K/uL 6.3 10.8(H)  Hemoglobin 12.0 - 15.0 g/dL 12.6 13.4  Hematocrit 36 - 46 % 38.4 38.9  Platelets 150 - 400 K/uL 216 242    CMP: CMP Latest Ref Rng & Units 06/07/2014 05/17/2014  Glucose 70 - 99 mg/dL 93 122(H)  BUN 6 - 20 mg/dL 12 9  Creatinine 0.44 - 1.00 mg/dL 0.70 0.66  Sodium 135 - 145 mmol/L 142 132(L)  Potassium 3.5 - 5.1 mmol/L 4.6 3.1(L)  Chloride 101 - 111 mmol/L 104 95(L)  CO2 22 - 32 mmol/L 29 25  Calcium 8.9 - 10.3 mg/dL 9.6 9.4  Total Protein 6.5 - 8.1 g/dL 6.9 7.3    Total Bilirubin 0.3 - 1.2 mg/dL 0.3 1.0  Alkaline Phos 38 - 126 U/L 68 57  AST 15 - 41 U/L 17 34  ALT 14 - 54 U/L 15 16     Carmell Austria, MD 07/22/2019, 9:33 AM  Cc: Dr. Helene Kelp

## 2019-07-23 ENCOUNTER — Other Ambulatory Visit: Payer: Self-pay

## 2019-07-23 DIAGNOSIS — R1031 Right lower quadrant pain: Secondary | ICD-10-CM

## 2019-07-23 DIAGNOSIS — R1013 Epigastric pain: Secondary | ICD-10-CM

## 2019-07-23 DIAGNOSIS — K5900 Constipation, unspecified: Secondary | ICD-10-CM

## 2019-07-29 ENCOUNTER — Encounter: Payer: Self-pay | Admitting: Gastroenterology

## 2019-07-30 ENCOUNTER — Telehealth: Payer: Self-pay | Admitting: Gastroenterology

## 2019-07-30 NOTE — Telephone Encounter (Signed)
Patient called requesting results.  

## 2019-08-16 NOTE — Telephone Encounter (Signed)
Hemangiomas in the liver do not cause problems I do not see the ultrasound report in our system.  Did she have ultrasound done as ordered during the last visit? Also plan as per last note. Did we get CT from [facility]?   [place]

## 2019-08-17 ENCOUNTER — Encounter: Payer: Self-pay | Admitting: Gastroenterology

## 2019-08-17 ENCOUNTER — Ambulatory Visit (AMBULATORY_SURGERY_CENTER): Payer: Medicare Other | Admitting: Gastroenterology

## 2019-08-17 ENCOUNTER — Other Ambulatory Visit (INDEPENDENT_AMBULATORY_CARE_PROVIDER_SITE_OTHER): Payer: Medicare Other

## 2019-08-17 ENCOUNTER — Other Ambulatory Visit: Payer: Self-pay

## 2019-08-17 VITALS — BP 147/68 | HR 62 | Temp 96.2°F | Resp 14 | Ht 66.0 in | Wt 161.0 lb

## 2019-08-17 DIAGNOSIS — K5730 Diverticulosis of large intestine without perforation or abscess without bleeding: Secondary | ICD-10-CM | POA: Diagnosis not present

## 2019-08-17 DIAGNOSIS — R1013 Epigastric pain: Secondary | ICD-10-CM | POA: Diagnosis not present

## 2019-08-17 DIAGNOSIS — K649 Unspecified hemorrhoids: Secondary | ICD-10-CM

## 2019-08-17 DIAGNOSIS — K5900 Constipation, unspecified: Secondary | ICD-10-CM

## 2019-08-17 DIAGNOSIS — K3189 Other diseases of stomach and duodenum: Secondary | ICD-10-CM

## 2019-08-17 DIAGNOSIS — R1031 Right lower quadrant pain: Secondary | ICD-10-CM

## 2019-08-17 LAB — COMPREHENSIVE METABOLIC PANEL
ALT: 13 U/L (ref 0–35)
AST: 17 U/L (ref 0–37)
Albumin: 4.7 g/dL (ref 3.5–5.2)
Alkaline Phosphatase: 68 U/L (ref 39–117)
BUN: 8 mg/dL (ref 6–23)
CO2: 31 mEq/L (ref 19–32)
Calcium: 10 mg/dL (ref 8.4–10.5)
Chloride: 99 mEq/L (ref 96–112)
Creatinine, Ser: 0.88 mg/dL (ref 0.40–1.20)
GFR: 63.26 mL/min (ref >60.00)
Glucose, Bld: 102 mg/dL — ABNORMAL HIGH (ref 70–99)
Potassium: 4 mEq/L (ref 3.5–5.1)
Sodium: 138 mEq/L (ref 135–145)
Total Bilirubin: 0.5 mg/dL (ref 0.2–1.2)
Total Protein: 7 g/dL (ref 6.0–8.3)

## 2019-08-17 LAB — CBC WITH DIFFERENTIAL/PLATELET
Basophils Absolute: 0 10*3/uL (ref 0.0–0.1)
Basophils Relative: 0.6 % (ref 0.0–3.0)
Eosinophils Absolute: 0.1 10*3/uL (ref 0.0–0.7)
Eosinophils Relative: 1.6 % (ref 0.0–5.0)
HCT: 37.2 % (ref 36.0–46.0)
Hemoglobin: 12.8 g/dL (ref 12.0–15.0)
Lymphocytes Relative: 32.1 % (ref 12.0–46.0)
Lymphs Abs: 2.3 10*3/uL (ref 0.7–4.0)
MCHC: 34.3 g/dL (ref 30.0–36.0)
MCV: 91.7 fl (ref 78.0–100.0)
Monocytes Absolute: 0.5 10*3/uL (ref 0.1–1.0)
Monocytes Relative: 6.2 % (ref 3.0–12.0)
Neutro Abs: 4.4 10*3/uL (ref 1.4–7.7)
Neutrophils Relative %: 59.5 % (ref 43.0–77.0)
Platelets: 323 10*3/uL (ref 150.0–400.0)
RBC: 4.05 Mil/uL (ref 3.87–5.11)
RDW: 14.4 % (ref 11.5–15.5)
WBC: 7.3 10*3/uL (ref 4.0–10.5)

## 2019-08-17 LAB — LIPASE: Lipase: 13 U/L (ref 11.0–59.0)

## 2019-08-17 MED ORDER — SODIUM CHLORIDE 0.9 % IV SOLN: 500.0000 mL | Freq: Once | INTRAVENOUS | Status: DC

## 2019-08-17 NOTE — Progress Notes (Signed)
V/S-CW  CHECK-IN-JB

## 2019-08-17 NOTE — Op Note (Signed)
[place] Patient Name: Angela Ortiz Procedure Date: 08/17/2019 2:05 PM MRN: [id number] Endoscopist: Jackquline Denmark , MD Age: 69 Referring MD:  Date of Birth: Jun 04, 1950 Gender: Female Account #: [id number] Procedure:                Upper GI endoscopy Indications:              Epigastric abdominal pain Medicines:                Monitored Anesthesia Care Procedure:                Pre-Anesthesia Assessment:                           - Prior to the procedure, a History and Physical                            was performed, and patient medications and                            allergies were reviewed. The patient's tolerance of                            previous anesthesia was also reviewed. The risks                            and benefits of the procedure and the sedation                            options and risks were discussed with the patient.                            All questions were answered, and informed consent                            was obtained. Prior Anticoagulants: The patient has                            taken no previous anticoagulant or antiplatelet                            agents. ASA Grade Assessment: II - A patient with                            mild systemic disease. After reviewing the risks                            and benefits, the patient was deemed in                            satisfactory condition to undergo the procedure.                           After obtaining informed consent, the endoscope was  passed under direct vision. Throughout the                            procedure, the patient's blood pressure, pulse, and                            oxygen saturations were monitored continuously. The                            Endoscope was introduced through the mouth, and                            advanced to the second part of duodenum. The upper                            GI endoscopy was accomplished  without difficulty.                            The patient tolerated the procedure well. Scope In: Scope Out: Findings:                 The examined esophagus was normal with well-defined                            Z-line at 36 cm, examined by NBI.                           Localized mild inflammation characterized by                            erythema was found in the gastric antrum. Biopsies                            were taken with a cold forceps for histology.                           The examined duodenum was normal. Biopsies were                            taken with a cold forceps for histology. Complications:            No immediate complications. Estimated Blood Loss:     Estimated blood loss: none. Impression:               -Mild gastritis Recommendation:           - Resume previous diet.                           - Continue omeprazole 20 mg p.o. once a day.                           - Avoid nonsteroidals.                           - Await pathology results.                           -  Return to GI clinic in 12 weeks. Jackquline Denmark, MD 08/17/2019 2:42:02 PM This report has been signed electronically.

## 2019-08-17 NOTE — Patient Instructions (Addendum)
Colonoscopy: handout on diverticulosis & hemorrhoids given to you today  Miralax 1 capful (17gms) in 8 oz water by mouth daily  Return to GI clinic in 12 weeks- make this appointment  Upper Endoscopy-   Handout on gastritis given to you   Continue Omeprazole 20 mg once a day- pt has refills   Avoid non steroidal drugs  Await biopsy results   YOU HAD AN ENDOSCOPIC PROCEDURE TODAY AT [place]:   Refer to the procedure report that was given to you for any specific questions about what was found during the examination.  If the procedure report does not answer your questions, please call your gastroenterologist to clarify.  If you requested that your care partner not be given the details of your procedure findings, then the procedure report has been included in a sealed envelope for you to review at your convenience later.  YOU SHOULD EXPECT: Some feelings of bloating in the abdomen. Passage of more gas than usual.  Walking can help get rid of the air that was put into your GI tract during the procedure and reduce the bloating. If you had a lower endoscopy (such as a colonoscopy or flexible sigmoidoscopy) you may notice spotting of blood in your stool or on the toilet paper. If you underwent a bowel prep for your procedure, you may not have a normal bowel movement for a few days.  Please Note:  You might notice some irritation and congestion in your nose or some drainage.  This is from the oxygen used during your procedure.  There is no need for concern and it should clear up in a day or so.  SYMPTOMS TO REPORT IMMEDIATELY:   Following lower endoscopy (colonoscopy or flexible sigmoidoscopy):  Excessive amounts of blood in the stool  Significant tenderness or worsening of abdominal pains  Swelling of the abdomen that is new, acute  Fever of 100F or higher   Following upper endoscopy (EGD)  Vomiting of blood or coffee ground material  New chest pain or pain under  the shoulder blades  Painful or persistently difficult swallowing  New shortness of breath  Fever of 100F or higher  Black, tarry-looking stools  For urgent or emergent issues, a gastroenterologist can be reached at any hour by calling [phone]. Do not use MyChart messaging for urgent concerns.    DIET:  We do recommend a small meal at first, but then you may proceed to your regular diet.  Drink plenty of fluids but you should avoid alcoholic beverages for 24 hours.  ACTIVITY:  You should plan to take it easy for the rest of today and you should NOT DRIVE or use heavy machinery until tomorrow (because of the sedation medicines used during the test).    FOLLOW UP: Our staff will call the number listed on your records 48-72 hours following your procedure to check on you and address any questions or concerns that you may have regarding the information given to you following your procedure. If we do not reach you, we will leave a message.  We will attempt to reach you two times.  During this call, we will ask if you have developed any symptoms of COVID 19. If you develop any symptoms (ie: fever, flu-like symptoms, shortness of breath, cough etc.) before then, please call [phone].  If you test positive for Covid 19 in the 2 weeks post procedure, please call and report this information to [place].    If any  biopsies were taken you will be contacted by phone or by letter within the next 1-3 weeks.  Please call us at [phone] if you have not heard about the biopsies in 3 weeks.    SIGNATURES/CONFIDENTIALITY: You and/or your care partner have signed paperwork which will be entered into your electronic medical record.  These signatures attest to the fact that that the information above on your After Visit Summary has been reviewed and is understood.  Full responsibility of the confidentiality of this discharge information lies with you and/or your care-partner.

## 2019-08-17 NOTE — Telephone Encounter (Signed)
Last CT and ultrasound placed on Dr. Lyndel Safe for review

## 2019-08-17 NOTE — Op Note (Addendum)
[facility] Patient Name: Angela Ortiz Procedure Date: 08/17/2019 2:05 PM MRN: [id number] Endoscopist: Jackquline Denmark , MD Age: 69 Referring MD:  Date of Birth: 1950-06-28 Gender: Female Account #: [id number] Procedure:                Colonoscopy Indications:              Abdominal pain in the right lower quadrant with                            constipation. Neg CT AP Medicines:                Monitored Anesthesia Care Procedure:                Pre-Anesthesia Assessment:                           - Prior to the procedure, a History and Physical                            was performed, and patient medications and                            allergies were reviewed. The patient's tolerance of                            previous anesthesia was also reviewed. The risks                            and benefits of the procedure and the sedation                            options and risks were discussed with the patient.                            All questions were answered, and informed consent                            was obtained. Prior Anticoagulants: The patient has                            taken no previous anticoagulant or antiplatelet                            agents. ASA Grade Assessment: II - A patient with                            mild systemic disease. After reviewing the risks                            and benefits, the patient was deemed in                            satisfactory condition to undergo the procedure.  After obtaining informed consent, the colonoscope                            was passed under direct vision. Throughout the                            procedure, the patient's blood pressure, pulse, and                            oxygen saturations were monitored continuously. The                            Colonoscope was introduced through the anus and                            advanced to the the cecum, identified by                             appendiceal orifice and ileocecal valve. Terminal                            ileum was just intubated. The colonoscopy was                            performed without difficulty. The patient tolerated                            the procedure well. The quality of the bowel                            preparation was adequate to identify polyps. Some                            retained stool in the right colon which could not                            be fully washed. The ileocecal valve, appendiceal                            orifice, and rectum were photographed. Scope In: 2:22:45 PM Scope Out: 2:37:30 PM Scope Withdrawal Time: 0 hours 11 minutes 28 seconds  Total Procedure Duration: 0 hours 14 minutes 45 seconds  Findings:                 A few small-mouthed diverticula were found in the                            sigmoid colon.                           Non-bleeding internal hemorrhoids were found during                            retroflexion. The hemorrhoids were small.  The exam was otherwise without abnormality on                            direct and retroflexion views. Complications:            No immediate complications. Estimated Blood Loss:     Estimated blood loss: none. Impression:               -Mild sigmoid diverticulosis.                           -Non-bleeding internal hemorrhoids.                           -Otherwise normal colonoscopy. Colon was redundant. Recommendation:           - Patient has a contact number available for                            emergencies. The signs and symptoms of potential                            delayed complications were discussed with the                            patient. Return to normal activities tomorrow.                            Written discharge instructions were provided to the                            patient.                           - Resume previous diet.                            - Continue present medications.                           - Miralax 1 capful (17 grams) in 8 ounces of water                            PO daily.                           - Return to GI clinic in 12 weeks.                           - No need to repeat colonoscopy d/t age unless any                            new problems.                           - The findings and recommendations were discussed  with the patient's family. Jackquline Denmark, MD 08/17/2019 2:45:56 PM This report has been signed electronically.

## 2019-08-17 NOTE — Progress Notes (Signed)
Report to PACU, RN, vss, BBS= Clear.  

## 2019-08-17 NOTE — Progress Notes (Signed)
Called to room to assist during endoscopic procedure.  Patient ID and intended procedure confirmed with present staff. Received instructions for my participation in the procedure from the performing physician.  

## 2019-08-19 ENCOUNTER — Telehealth: Payer: Self-pay

## 2019-08-19 LAB — CELIAC PANEL 10
Antigliadin Abs, IgA: 2 units (ref 0–19)
Endomysial IgA: NEGATIVE
Gliadin IgG: 2 units (ref 0–19)
IgA/Immunoglobulin A, Serum: 87 mg/dL (ref 64–422)
Tissue Transglut Ab: <2 U/mL (ref 0–5)
Transglutaminase IgA: <2 U/mL (ref 0–3)

## 2019-08-19 NOTE — Telephone Encounter (Signed)
  Follow up Call-  Call back number 08/17/2019  Post procedure Call Back phone  # [phone]  Permission to leave phone message Yes  Some recent data might be hidden     Patient questions:  Do you have a fever, pain , or abdominal swelling? No. Pain Score  0 *  Have you tolerated food without any problems? Yes.    Have you been able to return to your normal activities? Yes.    Do you have any questions about your discharge instructions: Diet   No. Medications  No. Follow up visit  No.  Do you have questions or concerns about your Care? No.  Actions: * If pain score is 4 or above: No action needed, pain <4.  1. Have you developed a fever since your procedure? no  2.   Have you had an respiratory symptoms (SOB or cough) since your procedure? no  3.   Have you tested positive for COVID 19 since your procedure no  4.   Have you had any family members/close contacts diagnosed with the COVID 19 since your procedure?  no   If yes to any of these questions please route to Joylene John, RN and Erenest Rasher, RN

## 2019-08-22 ENCOUNTER — Encounter: Payer: Self-pay | Admitting: Gastroenterology

## 2019-09-06 IMAGING — MG DIGITAL DIAGNOSTIC BILATERAL MAMMOGRAM WITH TOMO AND CAD
6 of 10 series · 6 of 30 positions shown · non-contrast
Comparison: Previous exam(s).

CLINICAL DATA: 67-year-old female with a large painful and palpable
abnormality in her right breast which developed following
explantation of bilateral silicone breast implants.

EXAM:
DIGITAL DIAGNOSTIC BILATERAL MAMMOGRAM WITH CAD AND TOMO
RIGHT BREAST ULTRASOUND

[L MLO synth-2D]
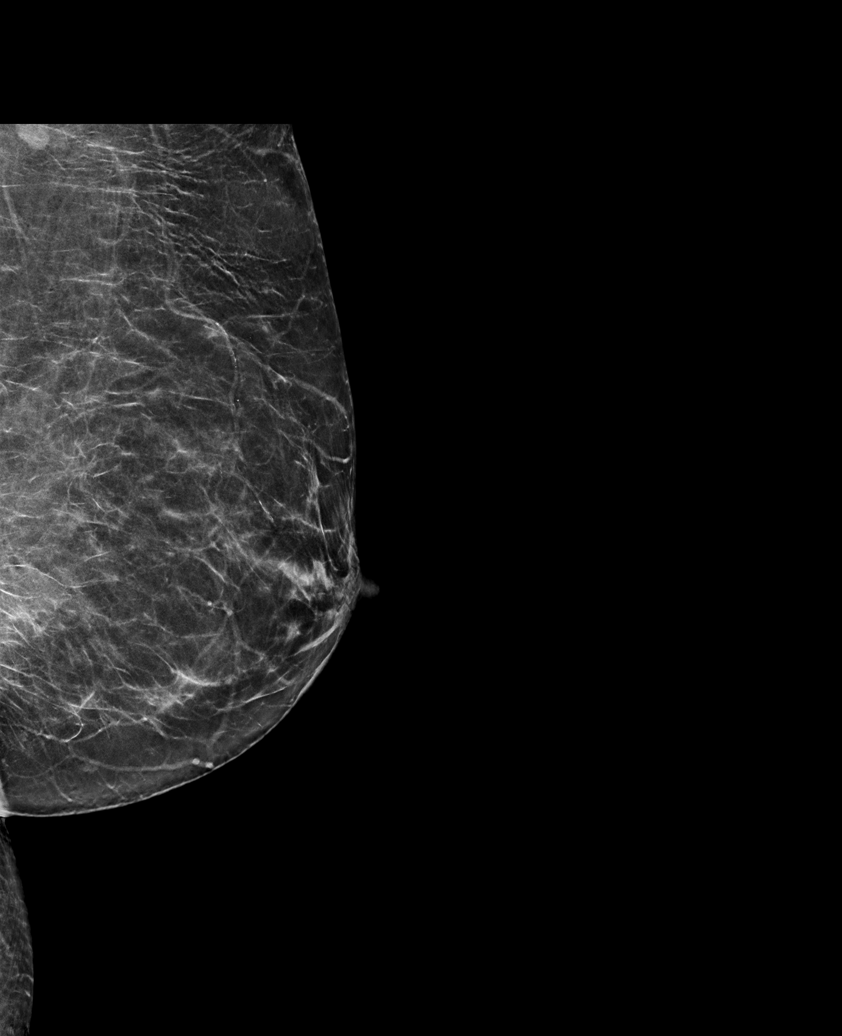

[R MLO synth-2D]
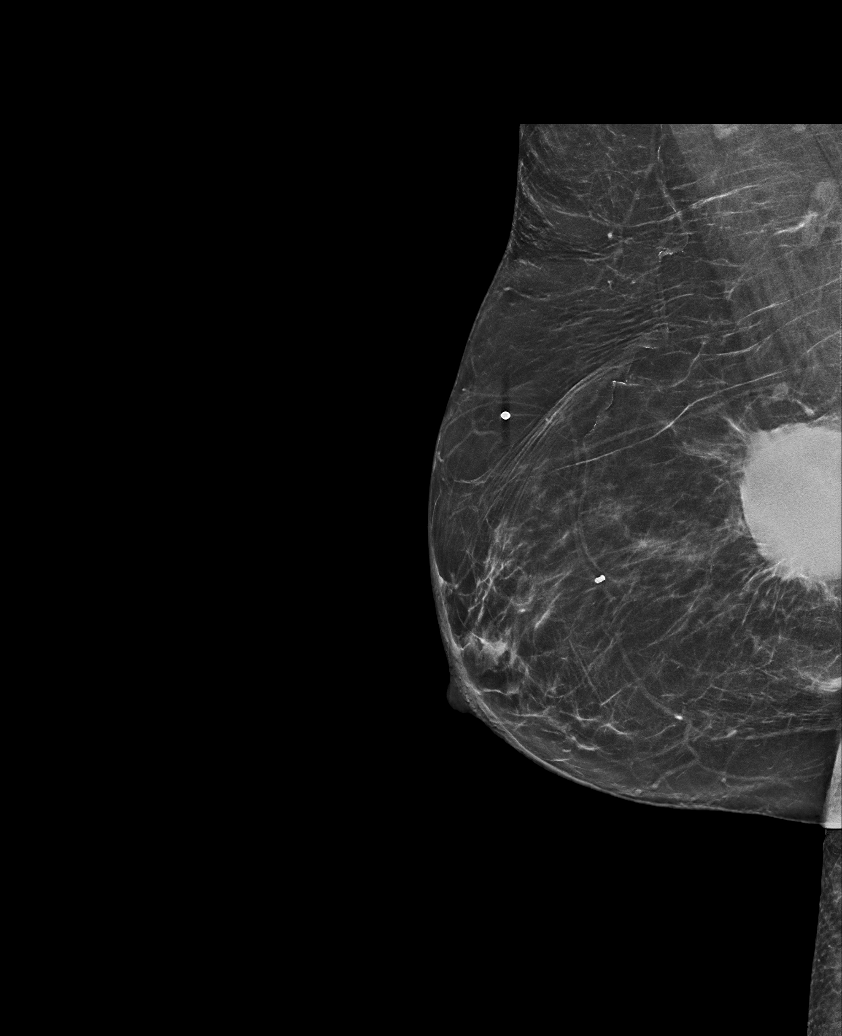

[R CC synth-2D (1 of 2)]
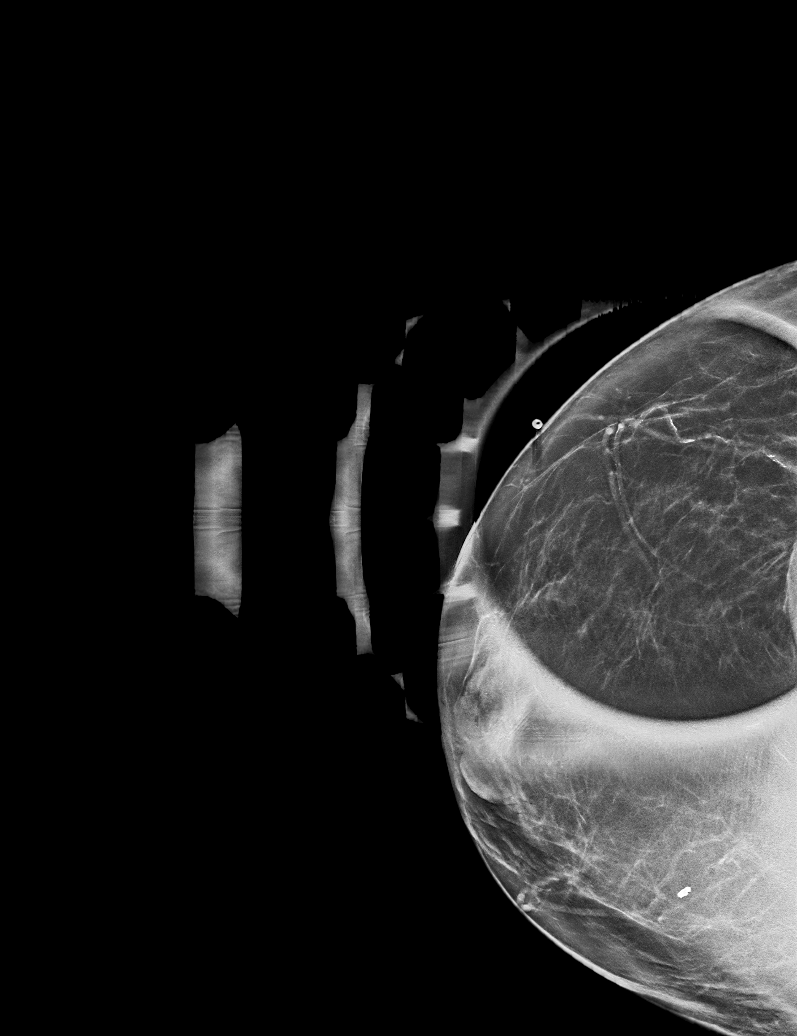

[L CC synth-2D]
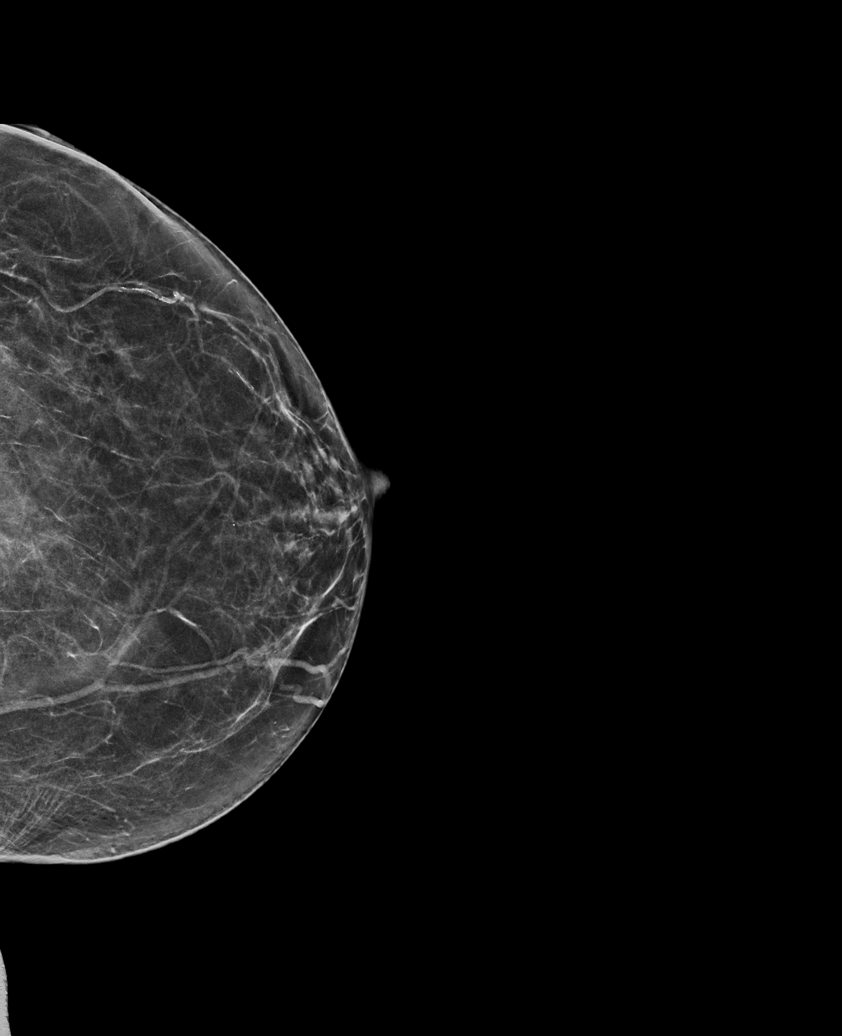

[R CC synth-2D (2 of 2)]
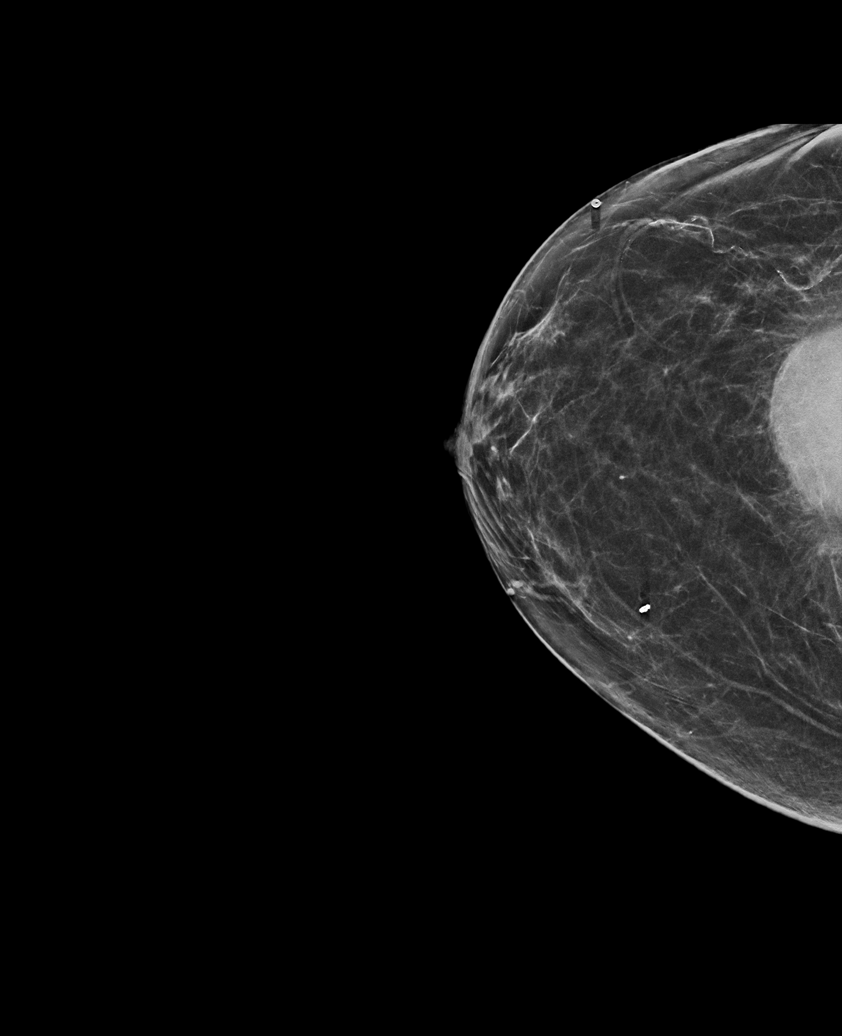

[R CC tomo · tomo slice 29/57.0]
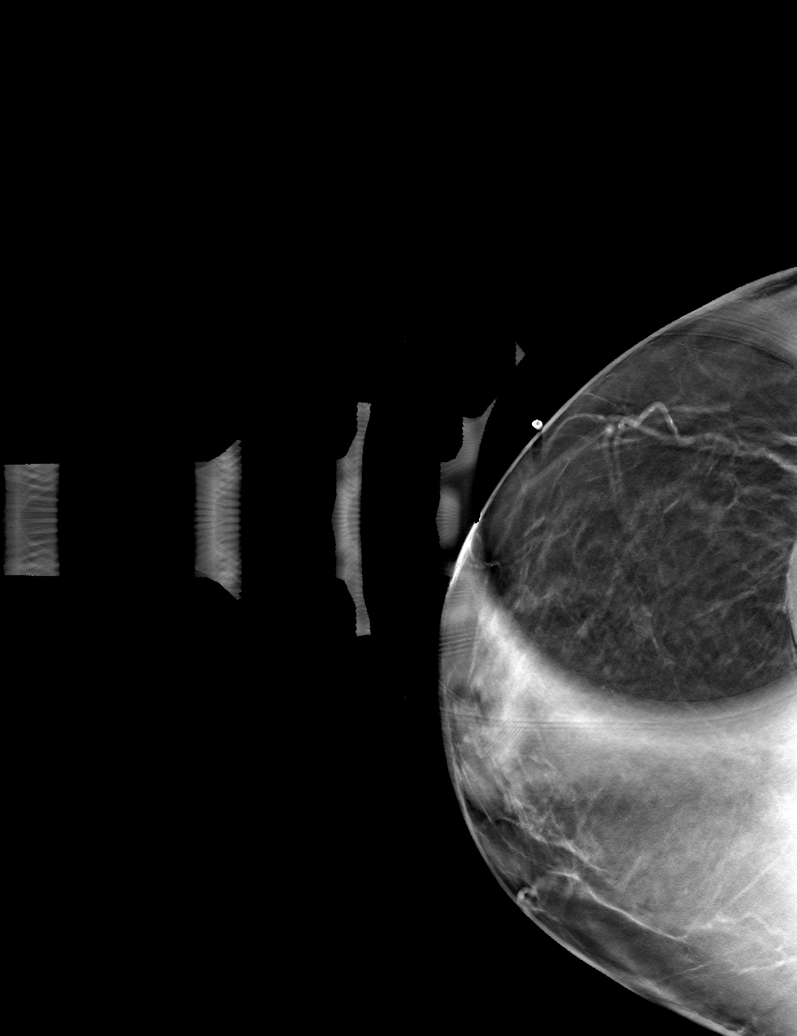

[6 of 30 positions shown; findings below may reference images not displayed]

ACR Breast Density Category b: There are scattered areas of
fibroglandular density.
FINDINGS: There is a round dense circumscribed mass in the far posterior right
breast at site of prior breast implant with surrounding postsurgical
distortion. This measures approximately 4.4 cm. No suspicious masses
or calcifications are seen in the left breast.

Mammographic images were processed with CAD.

Physical examination of the right breast reveals a hard tender mass
involving the central to slightly outer right breast.

Targeted ultrasound of the right breast was performed demonstrating
a circumscribed mixed echogenicity mass with small amount of mobile
fluid at 10 o'clock 6 cm from the nipple measuring 4.1 x 4 x 4 cm.
Findings are compatible with a postoperative collection with thick
minimally mobile fluid.
IMPRESSION: Postoperative collection with thick fluid/debris measuring greater
than 4 cm at site of palpable concern and tenderness in the right
breast. There are no findings of malignancy in either breast.

RECOMMENDATION:
1.  Recommend the patient follow up with her plastic surgeon.

2. An attempt at aspiration of the right breast collection could be
performed, although given the thick/solid appearance of the debris
in the collection minimal fluid would likely be aspirated. This was
discussed with the patient. She will call the [REDACTED] if she
decides to schedule in aspiration.

3.  Screening mammogram in one year.(Code:XE-[id number])

I have discussed the findings and recommendations with the patient.
Results were also provided in writing at the conclusion of the
visit. If applicable, a reminder letter will be sent to the patient
regarding the next appointment.

BI-RADS CATEGORY  2: Benign.

## 2019-10-05 ENCOUNTER — Other Ambulatory Visit: Payer: Self-pay

## 2019-10-05 ENCOUNTER — Ambulatory Visit (INDEPENDENT_AMBULATORY_CARE_PROVIDER_SITE_OTHER): Payer: Medicare Other | Admitting: Cardiology

## 2019-10-05 VITALS — BP 92/68 | Ht 66.5 in | Wt 158.0 lb

## 2019-10-05 DIAGNOSIS — R0602 Shortness of breath: Secondary | ICD-10-CM

## 2019-10-05 DIAGNOSIS — R9431 Abnormal electrocardiogram [ECG] [EKG]: Secondary | ICD-10-CM

## 2019-10-05 DIAGNOSIS — E782 Mixed hyperlipidemia: Secondary | ICD-10-CM

## 2019-10-05 DIAGNOSIS — R21 Rash and other nonspecific skin eruption: Secondary | ICD-10-CM

## 2019-10-05 DIAGNOSIS — I10 Essential (primary) hypertension: Secondary | ICD-10-CM

## 2019-10-05 DIAGNOSIS — L819 Disorder of pigmentation, unspecified: Secondary | ICD-10-CM

## 2019-10-05 DIAGNOSIS — M2559 Pain in other specified joint: Secondary | ICD-10-CM | POA: Diagnosis not present

## 2019-10-05 DIAGNOSIS — R600 Localized edema: Secondary | ICD-10-CM

## 2019-10-05 NOTE — Patient Instructions (Addendum)
Medication Instructions:   Your physician recommends that you continue on your current medications as directed. Please refer to the Current Medication list given to you today.   *If you need a refill on your cardiac medications before your next appointment, please call your pharmacy*   Lab Work: [place]   If you have labs (blood work) drawn today and your tests are completely normal, you will receive your results only by: Marland Kitchen MyChart Message (if you have MyChart) OR . A paper copy in the mail If you have any lab test that is abnormal or we need to change your treatment, we will call you to review the results.   Testing/Procedures: Your physician has requested that you have an echocardiogram. Echocardiography is a painless test that uses sound waves to create images of your heart. It provides your doctor with information about the size and shape of your heart and how well your heart's chambers and valves are working. This procedure takes approximately one hour. There are no restrictions for this procedure.  Your physician has requested that you have a lexiscan myoview. For further information please visit [email or web address]. Please follow instruction sheet, as given.    Follow-Up: At [facility], you and your health needs are our priority.  As part of our continuing mission to provide you with exceptional heart care, we have created designated Provider Care Teams.  These Care Teams include your primary Cardiologist (physician) and Advanced Practice Providers (APPs -  Physician Assistants and Nurse Practitioners) who all work together to provide you with the care you need, when you need it.  We recommend signing up for the patient portal called "MyChart".  Sign up information is provided on this After Visit Summary.  MyChart is used to connect with patients for Virtual Visits (Telemedicine).  Patients are able to view lab/test results, encounter notes, upcoming appointments,  etc.  Non-urgent messages can be sent to your provider as well.   To learn more about what you can do with MyChart, go to [email or web address].    Your next appointment:   3 month(s)  The format for your next appointment:   In Person  Provider:   You will see Dr Harriet Masson Or, you can be scheduled with the following Advanced Practice Provider on your designated Care Team (at our [facility]):  Laurann Montana, FNP     Other Instructions  You have been referred to Rheumatology

## 2019-10-05 NOTE — Progress Notes (Signed)
Cardiology Office Note:    Date:  10/05/2019   ID:  Angela Ortiz, DOB 1950/03/10, MRN [id number]  PCP:  Ronita Hipps, MD  Cardiologist:  Berniece Salines, DO  Electrophysiologist:  None   Referring MD: Ronita Hipps, MD   Patient has been having shortness of breath, my hands and feet does change colors and I do have some rash.  History of Present Illness:    Angela Ortiz is a 69 y.o. female with a hx of hypertension, hyperlipidemia bipolar disorder presents today to be evaluated for multiple things.  She tells me that she has been experiencing some shortness of breath with bilateral leg edema.  But was most bothersome for her is the fact that she tell me she has been having a rash intermittently for the last couple months and does notice that she does have blue discoloration of her feet and hands at times.  She is concerned about this.  She denies any chest pain, but notes that her shortness of breath is on exertion.  And tells me that today her leg edema looks better but most days she feels that her feet are swollen significantly.  Her blood pressure also has been up and down she tells me but recently her PCP increase her valsartan to 160 and that has helped control her blood pressure.  Although she says she does have times when in the afternoons her stomach blood pressures usually around 160 mmHg.  No other complaints at this time.  Past Medical History:  Diagnosis Date  . Allergy    seasonal  . Anxiety   . Bipolar disorder ([place])   . 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 Chronic headache   . Depression   . Elevated cholesterol   . Fibromyalgia   . Hypertension     Past Surgical History:  Procedure Laterality Date  . ABDOMINAL HYSTERECTOMY    . AUGMENTATION MAMMAPLASTY Bilateral    removed april 2019  . BREAST ENHANCEMENT SURGERY    . BREAST IMPLANT REMOVAL Bilateral 05/09/2017   Procedure: BILATERAL  REMOVAL BREAST IMPLANTS;  Surgeon: Irene Limbo,  MD;  Location: [place];  Service: Plastics;  Laterality: Bilateral;  . CAPSULECTOMY Left 05/09/2017   Procedure: CAPSULECTOMY AND REMOVAL OF IMPLANT MATERIAL ON LEFT;  Surgeon: Irene Limbo, MD;  Location: [place];  Service: Plastics;  Laterality: Left;  . ESOPHAGOGASTRODUODENOSCOPY     over 30 years ago  . explants Bilateral   . right arm surgery    . ULNA OSTEOTOMY  1992   partical    Current Medications: Current Meds  Medication Sig  . Acetaminophen (TYLENOL PO) Take 400 mg by mouth 3 (three) times daily. Takes 2 $Remov'200mg'pOmHoI$   . amantadine (SYMMETREL) 100 MG capsule Take 100 mg by mouth daily.  Marland Kitchen atorvastatin (LIPITOR) 10 MG tablet Take 10 mg by mouth daily.  . famotidine (PEPCID) 20 MG tablet Take 20 mg by mouth 2 (two) times daily.  . furosemide (LASIX) 40 MG tablet Take 40 mg by mouth daily.  Marland Kitchen gabapentin (NEURONTIN) 600 MG tablet Take 600 mg by mouth at bedtime.  . hydrOXYzine (VISTARIL) 25 MG capsule Take 25 mg by mouth 3 (three) times daily.   Marland Kitchen levothyroxine (SYNTHROID) 25 MCG tablet Take 25 mcg by mouth daily before breakfast.  . LITHIUM CARBONATE PO Take 225 mg by mouth daily.  Marland Kitchen LORazepam (ATIVAN) 1 MG tablet Take 1 mg by mouth  3 (three) times daily.  . Multiple Vitamin (MULTIVITAMIN WITH MINERALS) TABS tablet Take 1 tablet by mouth daily.   Marland Kitchen omeprazole (PRILOSEC) 20 MG capsule Take 1 capsule (20 mg total) by mouth daily before breakfast. Take 30 minutes before breakfast  . polyethylene glycol (MIRALAX / GLYCOLAX) 17 g packet Take 17 g by mouth as needed.  . valsartan (DIOVAN) 160 MG tablet Take 160 mg by mouth daily.  . vitamin B-12 (CYANOCOBALAMIN) 1000 MCG tablet Take 1,000 mcg by mouth daily.      Allergies:   Chlorhexidine   Social History   Socioeconomic History  . Marital status: Married    Spouse name: Not on file  . Number of children: Not on file  . Years of education: Not on file  . Highest education level: Not on file    Occupational History  . Not on file  Tobacco Use  . Smoking status: Never Smoker  . Smokeless tobacco: Never Used  Vaping Use  . Vaping Use: Never used  Substance and Sexual Activity  . Alcohol use: Not Currently    Alcohol/week: 7.0 standard drinks    Types: 7 Glasses of wine per week  . Drug use: Never  . Sexual activity: Not on file  Other Topics Concern  . Not on file  Social History Narrative  . Not on file   Social Determinants of Health   Financial Resource Strain:   . Difficulty of Paying Living Expenses: Not on file  Food Insecurity:   . Worried About Charity fundraiser in the Last Year: Not on file  . Ran Out of Food in the Last Year: Not on file  Transportation Needs:   . Lack of Transportation (Medical): Not on file  . Lack of Transportation (Non-Medical): Not on file  Physical Activity:   . Days of Exercise per Week: Not on file  . Minutes of Exercise per Session: Not on file  Stress:   . Feeling of Stress : Not on file  Social Connections:   . Frequency of Communication with Friends and Family: Not on file  . Frequency of Social Gatherings with Friends and Family: Not on file  . Attends Religious Services: Not on file  . Active Member of Clubs or Organizations: Not on file  . Attends Archivist Meetings: Not on file  . Marital Status: Not on file     Family History: The patient's family history includes Diabetes in her father; Heart disease in her father. There is no history of Breast cancer, Colon cancer, Esophageal cancer, Colon polyps, Rectal cancer, or Stomach cancer.  ROS:   Review of Systems  Constitution: Negative for decreased appetite, fever and weight gain.  HENT: Negative for congestion, ear discharge, hoarse voice and sore throat.   Eyes: Negative for discharge, redness, vision loss in right eye and visual halos.  Cardiovascular: Reports shortness of breath.  Bilateral leg edema.  Negative for chest pain, orthopnea and  palpitations.  Respiratory: Negative for cough, hemoptysis, shortness of breath and snoring.   Endocrine: Negative for heat intolerance and polyphagia.  Hematologic/Lymphatic: Negative for bleeding problem. Does not bruise/bleed easily.  Skin: Reports intermittent rash and blue discoloration of her feet and hands.  Negative for flushing, nail changes, and suspicious lesions.  Musculoskeletal: Negative for arthritis, joint pain, muscle cramps, myalgias, neck pain and stiffness.  Gastrointestinal: Negative for abdominal pain, bowel incontinence, diarrhea and excessive appetite.  Genitourinary: Negative for decreased libido, genital sores and incomplete emptying.  Neurological: Negative for brief paralysis, focal weakness, headaches and loss of balance.  Psychiatric/Behavioral: Negative for altered mental status, depression and suicidal ideas.  Allergic/Immunologic: Negative for HIV exposure and persistent infections.    EKGs/Labs/Other Studies Reviewed:    The following studies were reviewed today:   EKG:  The ekg ordered today demonstrates sinus rhythm, heart rate 70 bpm with poor R wave progression which could be suggestive of septal wall infarction.  Recent Labs: 08/17/2019: ALT 13; BUN 8; Creatinine, Ser 0.88; Hemoglobin 12.8; Platelets 323.0; Potassium 4.0; Sodium 138  Blood work done by her PCP on Jun 10, 2019 shows WBC 5.6, hemoglobin 13.2, hematocrit 39, platelet 252 Chemistry: Glucose 89, BUN 12, creatinine 0.9, total bili 0.3, AST 14, ALT 10, alk phos 73, calcium 10.5, sodium 142, potassium 4.9, chloride 102, bicarb 31, total protein 6.8, albumin 4.7,  Recent Lipid Panel No results found for: CHOL, TRIG, HDL, CHOLHDL, VLDL, LDLCALC, LDLDIRECT  Lipid profile triglyceride 125, total cholesterol 177, HDL 57, LDL 95  Physical Exam:    VS:  BP 92/68   Ht 5' 6.5" (1.689 m)   Wt 158 lb (71.7 kg)   SpO2 97%   BMI 25.12 kg/m     Wt Readings from Last 3 Encounters:  10/05/19 158 lb  (71.7 kg)  08/17/19 161 lb (73 kg)  07/22/19 161 lb 2 oz (73.1 kg)     GEN: Well nourished, well developed in no acute distress HEENT: Normal NECK: No JVD; No carotid bruits LYMPHATICS: No lymphadenopathy CARDIAC: S1S2 noted,RRR, no murmurs, rubs, gallops RESPIRATORY:  Clear to auscultation without rales, wheezing or rhonchi  ABDOMEN: Soft, non-tender, non-distended, +bowel sounds, no guarding. EXTREMITIES: No edema, No cyanosis, no clubbing MUSCULOSKELETAL:  No deformity  SKIN: Warm and dry NEUROLOGIC:  Alert and oriented x 3, non-focal PSYCHIATRIC:  Normal affect, good insight  ASSESSMENT:    1. Shortness of breath   2. Pain in other joint   3. Discoloration of skin of multiple sites of lower extremity   4. Rash   5. Abnormal EKG   6. Essential hypertension   7. Mixed hyperlipidemia    PLAN:     1.  Her shortness of breath and abnormal EKG with her risk factors I am going to proceed an ischemic evaluation in this patient with a pharmacologic nuclear stress test.  I have educated patient about this test and she said willing to proceed with this.  In addition for completeness her shortness of breath and bilateral leg edema I would like to get an echocardiogram to assess for any dilated cardiomyopathy and assess her EF.  Her blood pressure is slightly on the lower side today.  I like to decrease her medication but she tells me in the afternoon this goes up in the 160s and she would rather hold off with this change for now.  I have asked the patient take her blood pressure daily and bring this information to her next office visit.  In terms of her constellation of symptoms of rash, joint pain as well as feet and hand discoloration I do think the patient should be evaluated by rheumatology to rule out any evidence of Sjogren syndrome.  She is agreeable to see rheumatology.  Hyperlipidemia continue her atorvastatin 10 mg daily.  The patient is in agreement with the above plan. The  patient left the office in stable condition.  The patient will follow up in 3 months or sooner if needed.   Medication Adjustments/Labs and Tests  Ordered: Current medicines are reviewed at length with the patient today.  Concerns regarding medicines are outlined above.  Orders Placed This Encounter  Procedures  . Ambulatory referral to Rheumatology  . MYOCARDIAL PERFUSION IMAGING  . EKG 12-Lead  . ECHOCARDIOGRAM COMPLETE   No orders of the defined types were placed in this encounter.   Patient Instructions  Medication Instructions:   Your physician recommends that you continue on your current medications as directed. Please refer to the Current Medication list given to you today.   *If you need a refill on your cardiac medications before your next appointment, please call your pharmacy*   Lab Work: [place]   If you have labs (blood work) drawn today and your tests are completely normal, you will receive your results only by: Marland Kitchen MyChart Message (if you have MyChart) OR . A paper copy in the mail If you have any lab test that is abnormal or we need to change your treatment, we will call you to review the results.   Testing/Procedures: Your physician has requested that you have an echocardiogram. Echocardiography is a painless test that uses sound waves to create images of your heart. It provides your doctor with information about the size and shape of your heart and how well your heart's chambers and valves are working. This procedure takes approximately one hour. There are no restrictions for this procedure.  Your physician has requested that you have a lexiscan myoview. For further information please visit [email or web address]. Please follow instruction sheet, as given.    Follow-Up: At [facility], you and your health needs are our priority.  As part of our continuing mission to provide you with exceptional heart care, we have created designated Provider Care  Teams.  These Care Teams include your primary Cardiologist (physician) and Advanced Practice Providers (APPs -  Physician Assistants and Nurse Practitioners) who all work together to provide you with the care you need, when you need it.  We recommend signing up for the patient portal called "MyChart".  Sign up information is provided on this After Visit Summary.  MyChart is used to connect with patients for Virtual Visits (Telemedicine).  Patients are able to view lab/test results, encounter notes, upcoming appointments, etc.  Non-urgent messages can be sent to your provider as well.   To learn more about what you can do with MyChart, go to [email or web address].    Your next appointment:   3 month(s)  The format for your next appointment:   In Person  Provider:   You will see Dr Harriet Masson Or, you can be scheduled with the following Advanced Practice Provider on your designated Care Team (at our [facility]):  Laurann [place], FNP     Other Instructions  You have been referred to Rheumatology       Adopting a Healthy Lifestyle.  Know what a healthy weight is for you (roughly BMI <25) and aim to maintain this   Aim for 7+ servings of fruits and vegetables daily   65-80+ fluid ounces of water or unsweet tea for healthy kidneys   Limit to max 1 drink of alcohol per day; avoid smoking/tobacco   Limit animal fats in diet for cholesterol and heart health - choose grass fed whenever available   Avoid highly processed foods, and foods high in saturated/trans fats   Aim for low stress - take time to unwind and care for your mental health   Aim for 150 min of moderate intensity  exercise weekly for heart health, and weights twice weekly for bone health   Aim for 7-9 hours of sleep daily   When it comes to diets, agreement about the perfect plan isnt easy to find, even among the experts. Experts at the [employer] developed an idea known as the Healthy Eating  Plate. Just imagine a plate divided into logical, healthy portions.   The emphasis is on diet quality:   Load up on vegetables and fruits - one-half of your plate: Aim for color and variety, and remember that potatoes dont count.   Go for whole grains - one-quarter of your plate: Whole wheat, barley, wheat berries, quinoa, oats, brown rice, and foods made with them. If you want pasta, go with whole wheat pasta.   Protein power - one-quarter of your plate: Fish, chicken, beans, and nuts are all healthy, versatile protein sources. Limit red meat.   The diet, however, does go beyond the plate, offering a few other suggestions.   Use healthy plant oils, such as olive, canola, soy, corn, sunflower and peanut. Check the labels, and avoid partially hydrogenated oil, which have unhealthy trans fats.   If youre thirsty, drink water. Coffee and tea are good in moderation, but skip sugary drinks and limit milk and dairy products to one or two daily servings.   The type of carbohydrate in the diet is more important than the amount. Some sources of carbohydrates, such as vegetables, fruits, whole grains, and beans-are healthier than others.   Finally, stay active  Signed, Berniece Salines, DO  10/05/2019 10:42 AM    [place]

## 2019-10-28 ENCOUNTER — Other Ambulatory Visit: Payer: Medicare Other

## 2019-11-12 NOTE — Progress Notes (Signed)
Office Visit Note  Patient: Angela Ortiz             Date of Birth: 19-Jan-1951           MRN: [id number]             PCP: Ronita Hipps, MD Referring: Berniece Salines, DO Visit Date: 11/15/2019   Subjective:  Rash (worse on limbs and face, symptom onset began 8-12 months ago, toes and fingers are cold and patient complains of a burning sensation over the rest of her body)   History of Present Illness: Angela Ortiz is a 69 y.o. female here for evaluation of skin rash and episodes of finger discoloration. She reports new skin rash characterized by patchy redness with burning and itching sensation over her face, arms, legs, and upper chest intermittently for the past 8-12 months. During this same time she has experienced blisters on her right fingers that resolve spontaneously. She has also noticed finger discoloration with pallor followed by blue or purple discoloration with cold exposure that is new for her. She has a history of chronic pain and this is also worse though she does not usually observe joint swelling, warmth, or redness along with this pain. She denies dry eyes but has dry mouth, although this was made worse by use of hydroxyzine for her skin rash symptoms. She does feel the skin is more active after sun exposure. She is chronically fatigued and awakens many times at night, she has not had a sleep study. She has had intermittent painless sores in the mouth. She denies alopecia, pleurisy, eye inflammation, history of pericardial effusion, blood clots, or digital ulcers. Collateral history was obtained from her husband who was present. He reports seeing erythematous rash 8 months ago but has not seen obvious rash or bruising since that time although agrees her reported symptoms of pain and itching have not improved during this same time. We discussed a previous possible diagnosis of fibromyalgia 10 years ago that was complicated by other chronic pain and pain medications at  the time which are no longer an issue for years.  Activities of Daily Living:  Patient reports morning stiffness for 1-3 hours.   Patient Reports nocturnal pain.  Difficulty dressing/grooming: Denies Difficulty climbing stairs: Reports Difficulty getting out of chair: Denies Difficulty using hands for taps, buttons, cutlery, and/or writing: Denies  Review of Systems  Constitutional: Positive for fatigue.  HENT: Positive for mouth sores and mouth dryness. Negative for nose dryness.        Mouth dryness due to medication  Eyes: Positive for dryness. Negative for pain, itching and visual disturbance.  Respiratory: Positive for cough and shortness of breath. Negative for hemoptysis and difficulty breathing.        Occasional SOB and mild cough  Cardiovascular: Positive for palpitations and swelling in legs/feet. Negative for chest pain.       Patient was seen by cardiology within the last few months  Gastrointestinal: Negative for abdominal pain, blood in stool, constipation and diarrhea.  Endocrine: Negative for increased urination.  Genitourinary: Negative for painful urination.  Musculoskeletal: Positive for arthralgias, joint pain, joint swelling, myalgias, morning stiffness and myalgias. Negative for muscle weakness and muscle tenderness.  Skin: Positive for color change, rash and redness.  Allergic/Immunologic: Negative for susceptible to infections.  Neurological: Positive for dizziness, numbness, memory loss and weakness. Negative for headaches.       Memory loss per husband  Hematological: Negative for swollen glands.  Psychiatric/Behavioral: Positive for depressed mood, confusion and sleep disturbance. The patient is nervous/anxious.        Confusion per husband    PMFS History:  Patient Active Problem List   Diagnosis Date Noted  . Headache disorder 11/15/2019  . Rash and other nonspecific skin eruption 11/15/2019  . Arthralgia 11/15/2019  . Osteoarthritis of knees,  bilateral 11/15/2019  . Raynaud phenomenon 11/15/2019  . Shortness of breath 10/05/2019  . Abnormal EKG 10/05/2019  . Essential hypertension 10/05/2019  . Mixed hyperlipidemia 10/05/2019  . Nuclear sclerosis of both eyes 05/07/2017  . Vitreous floaters of both eyes 05/07/2017  . Fibromyalgia 04/17/2017  . Unspecified convulsions ([place]) 06/14/2014  . Bipolar affective disorder, manic, severe ([place]) 06/08/2014  . Encounter for preadmission testing   . Mania ([place])   . Anxiety 05/18/2014  . Panic disorder 05/18/2014  . Insomnia 05/18/2014    Past Medical History:  Diagnosis Date  . Allergy    seasonal  . Anxiety   . Bipolar disorder ([place])   . 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 Chronic headache   . Depression   . Elevated cholesterol   . Fibromyalgia   . Hypertension     Family History  Problem Relation Age of Onset  . Diabetes Father   . Heart disease Father   . Cancer Mother   . Osteoarthritis Mother   . Breast cancer Neg Hx   . Colon cancer Neg Hx   . Esophageal cancer Neg Hx   . Colon polyps Neg Hx   . Rectal cancer Neg Hx   . Stomach cancer Neg Hx    Past Surgical History:  Procedure Laterality Date  . ABDOMINAL HYSTERECTOMY    . AUGMENTATION MAMMAPLASTY Bilateral    removed april 2019  . BREAST ENHANCEMENT SURGERY    . BREAST IMPLANT REMOVAL Bilateral 05/09/2017   Procedure: BILATERAL  REMOVAL BREAST IMPLANTS;  Surgeon: Irene Limbo, MD;  Location: [place];  Service: Plastics;  Laterality: Bilateral;  . CAPSULECTOMY Left 05/09/2017   Procedure: CAPSULECTOMY AND REMOVAL OF IMPLANT MATERIAL ON LEFT;  Surgeon: Irene Limbo, MD;  Location: [place];  Service: Plastics;  Laterality: Left;  . ESOPHAGOGASTRODUODENOSCOPY     over 30 years ago  . explants Bilateral   . right arm surgery    . ULNA OSTEOTOMY  1992   partical   Social History   Social History Narrative  . Not on file   Immunization History    Administered Date(s) Administered  . Moderna SARS-COVID-2 Vaccination 03/18/2019, 04/15/2019     Objective: Vital Signs: BP 124/69 (BP Location: Right Arm, Patient Position: Sitting, Cuff Size: Normal)   Pulse 80   Resp 16   Ht 5' 5.5" (1.664 m)   Wt 156 lb 3.2 oz (70.9 kg)   BMI 25.60 kg/m    Physical Exam HENT:     Head: Atraumatic.     Right Ear: External ear normal.     Left Ear: External ear normal.     Mouth/Throat:     Mouth: Mucous membranes are moist.     Pharynx: Oropharynx is clear.  Eyes:     Conjunctiva/sclera: Conjunctivae normal.     Pupils: Pupils are equal, round, and reactive to light.  Cardiovascular:     Rate and Rhythm: Normal rate and regular rhythm.     Pulses: Normal pulses.  Pulmonary:     Effort: Pulmonary effort is normal.  Breath sounds: Normal breath sounds.  Skin:    General: Skin is warm and dry.     Comments: Skin appears clear at this time on scalp, face, neck, arms, trunk, and legs. Small erythemarous macules on glabrous surface of right 3rd finger and some skin peeling on distal phalanx. No nailford capillary changes seen. Red and dusky toe discolorations especially 2-3rd right DIPs. Nail changes with yellow and thickening on right toes and left 1st toe.  Neurological:     Mental Status: She is alert.      Musculoskeletal Exam:  Normal neck ROM with tenderness over paraspinal muscles Normal shoulder ROM with pain reaching behind low back and with palpation no swelling Elbow, wrist, MCP, PIP, DIP joints all normal ROM with diffuse tenderness to palpation but no swelling seen Hip internal and external rotation provokes lateral hip pain and lateral hip tenderness to palpation    CDAI Exam: CDAI Score: -- Patient Global: --; Provider Global: -- Swollen: --; Tender: -- Joint Exam 11/15/2019   No joint exam has been documented for this visit   There is currently no information documented on the homunculus. Go to the Rheumatology  activity and complete the homunculus joint exam.  Investigation: No additional findings.  Imaging: No results found.  Recent Labs: Lab Results  Component Value Date   WBC 7.3 08/17/2019   HGB 12.8 08/17/2019   PLT 323.0 08/17/2019   NA 138 08/17/2019   K 4.0 08/17/2019   CL 99 08/17/2019   CO2 31 08/17/2019   GLUCOSE 102 (H) 08/17/2019   BUN 8 08/17/2019   CREATININE 0.88 08/17/2019   BILITOT 0.5 08/17/2019   ALKPHOS 68 08/17/2019   AST 17 08/17/2019   ALT 13 08/17/2019   PROT 7.0 08/17/2019   ALBUMIN 4.7 08/17/2019   CALCIUM 10.0 08/17/2019   GFRAA >60 06/07/2014    Speciality Comments: No specialty comments available.  Procedures:  No procedures performed Allergies: Chlorhexidine   Assessment / Plan:     Visit Diagnoses: Rash and other nonspecific skin eruption - Plan: ANA, Anti-Smith antibody, Anti-DNA antibody, double-stranded, Anti-scleroderma antibody, RNP Antibody, Sjogrens syndrome-A extractable nuclear antibody, Sjogrens syndrome-B extractable nuclear antibody, C3 and C4, Sedimentation rate, C-reactive protein  Positive ANA alongside diffuse body rash and arthralgias and possible rainouts phenomenon.  Does not meet classification criteria for systemic lupus at this time but will obtain additional serology that could affect this.  Her recurrent eruptions of blistering along fingers on only 1 hand raise a question of shingles or HSV to me but does not have any other clear history for these.  If lab findings are unremarkable if positive consider observation versus treatment as very mild disease activity.  Arthralgia, unspecified joint - Plan: Sedimentation rate, C-reactive protein  She reports pain in numerous joints in all extremities but particularly describes difficulty with range of motion and grip in the bilateral hands.  Is not clear if there is any active inflammatory change or does this represent pain from existing osteoarthritis.  We will check inflammatory  markers.  Osteoarthritis of both knees, unspecified osteoarthritis type  Bilateral patellofemoral crepitus present although range of motion is preserved in both knees.  No effusions or redness suggesting active inflammation or effusions at this time.  Discussed this finding and therapy as next recommended treatment option. But not limiting her currently and not her major complaint today.  Orders: Orders Placed This Encounter  Procedures  . ANA  . Anti-Smith antibody  . Anti-DNA antibody, double-stranded  .  Anti-scleroderma antibody  . RNP Antibody  . Sjogrens syndrome-A extractable nuclear antibody  . Sjogrens syndrome-B extractable nuclear antibody  . C3 and C4  . Sedimentation rate  . C-reactive protein   No orders of the defined types were placed in this encounter.    Follow-Up Instructions: Return in about 3 weeks (around 12/06/2019).   Collier Salina, MD  Note - This record has been created using [employer].  Chart creation errors have been sought, but may not always  have been located. Such creation errors do not reflect on  the standard of medical care.

## 2019-11-15 ENCOUNTER — Ambulatory Visit (INDEPENDENT_AMBULATORY_CARE_PROVIDER_SITE_OTHER): Payer: Medicare Other | Admitting: Internal Medicine

## 2019-11-15 ENCOUNTER — Other Ambulatory Visit: Payer: Self-pay

## 2019-11-15 ENCOUNTER — Encounter: Payer: Self-pay | Admitting: Internal Medicine

## 2019-11-15 VITALS — BP 124/69 | HR 80 | Resp 16 | Ht 65.5 in | Wt 156.2 lb

## 2019-11-15 DIAGNOSIS — R21 Rash and other nonspecific skin eruption: Secondary | ICD-10-CM | POA: Diagnosis not present

## 2019-11-15 DIAGNOSIS — M170 Bilateral primary osteoarthritis of knee: Secondary | ICD-10-CM | POA: Diagnosis not present

## 2019-11-15 DIAGNOSIS — I7300 Raynaud's syndrome without gangrene: Secondary | ICD-10-CM | POA: Insufficient documentation

## 2019-11-15 DIAGNOSIS — M2550 Pain in unspecified joint: Secondary | ICD-10-CM | POA: Diagnosis not present

## 2019-11-15 DIAGNOSIS — R519 Headache, unspecified: Secondary | ICD-10-CM | POA: Insufficient documentation

## 2019-11-15 NOTE — Patient Instructions (Signed)
I am checking multiple tests associated with autoimmune disease that could be an underlying cause for your symptoms. These may be helpful in identifying a condition which we could treat. If all negative it may be difficult to address at this time so may have to monitor clinically.  Some of the fatigue and joint pain may be related to fibromyalgia, which causes more sensitivity to pain despite only normal levels of inflammation. This often occurs alongside chronic problems such as arthritis and makes the symptoms worse. A resource you can read more about this and self-care treatments that could be helpful is maintained by the [facility]:  [email or web address]   Let's plan to follow up for any change in symptoms and to discuss results in about 3 weeks.

## 2019-11-16 ENCOUNTER — Encounter: Payer: Self-pay | Admitting: Cardiology

## 2019-11-17 ENCOUNTER — Telehealth: Payer: Self-pay

## 2019-11-17 NOTE — Telephone Encounter (Signed)
Please see below and contact patient. Thank you.

## 2019-11-17 NOTE — Telephone Encounter (Signed)
Please inform Ms. Beaudry her initial lab results are negative for all tests so far (included below). Repeat of ANA titer not yet resulted. This mostly excludes lupus as a cause of her symptoms. I do not recommend any new medical treatment at this time. I would still like to follow up as previously planned.  Component                                                            Latest Ref Rng & Units                   11/15/2019               C3 Complement                                                        83 - 193 mg/dL                           130                      C4 Complement                                                        15 - 57 mg/dL                            31                       ENA SM Ab Ser-aCnc                                                   <1.0 NEG AI                              <1.0 NEG                 ds DNA Ab                                                            IU/mL                                    <1                       Scleroderma (  Scl-70) (ENA) Antibody, IgG                             <1.0 NEG AI                              <1.0 NEG                 Ribonucleic Protein(ENA) Antibody, IgG                               <1.0 NEG AI                              <1.0 NEG                 SSA (Ro) (ENA) Antibody, IgG                                         <1.0 NEG AI                              <1.0 NEG                 SSB (La) (ENA) Antibody, IgG                                         <1.0 NEG AI                              <1.0 NEG                 Sed Rate                                                             0 - 30 mm/h                              2                        CRP                                                                  <8.0 mg/L                                0.3

## 2019-11-17 NOTE — Telephone Encounter (Signed)
Patient called requesting a return call to discuss the results of her labwork.  Patient states she is in the process of signing up for Mychart so she will be able to receive the results online.

## 2019-11-17 NOTE — Telephone Encounter (Signed)
Spoke with patient, advised Angela Ortiz her initial lab results are negative for all tests so far. Repeat of ANA titer not yet resulted, I let her know we'll be in touch once it is. Advised patient this mostly excludes lupus as a cause of her symptoms and Dr. Benjamine Mola does not recommend any new medical treatment at this time. Per Dr. Benjamine Mola okay to keep follow up appointment as originally scheduled.

## 2019-11-18 ENCOUNTER — Telehealth: Payer: Self-pay

## 2019-11-18 NOTE — Telephone Encounter (Signed)
Patient called stating her rash is getting worse in the afternoon and evening hours, as well as the fingertips of both hands feeling icy cold.  Patient states her fingers and toes turn purple and red and feel like they are tingling.  Patient is aware Dr. Benjamine Mola is still waiting for the results of her ANA, but is requesting a return call to let her know if she could have Raynaud's.

## 2019-11-19 ENCOUNTER — Telehealth: Payer: Self-pay | Admitting: Cardiology

## 2019-11-19 LAB — C3 AND C4
C3 Complement: 130 mg/dL (ref 83–193)
C4 Complement: 31 mg/dL (ref 15–57)

## 2019-11-19 LAB — RNP ANTIBODY: Ribonucleic Protein(ENA) Antibody, IgG: <1 AI

## 2019-11-19 LAB — SJOGRENS SYNDROME-A EXTRACTABLE NUCLEAR ANTIBODY: SSA (Ro) (ENA) Antibody, IgG: <1 AI

## 2019-11-19 LAB — SJOGRENS SYNDROME-B EXTRACTABLE NUCLEAR ANTIBODY: SSB (La) (ENA) Antibody, IgG: <1 AI

## 2019-11-19 LAB — SEDIMENTATION RATE: Sed Rate: 2 mm/h (ref 0–30)

## 2019-11-19 LAB — ANTI-SMITH ANTIBODY: ENA SM Ab Ser-aCnc: <1 AI

## 2019-11-19 LAB — ANTI-NUCLEAR AB-TITER (ANA TITER): ANA Titer 1: 1:40 {titer} — ABNORMAL HIGH

## 2019-11-19 LAB — ANTI-DNA ANTIBODY, DOUBLE-STRANDED: ds DNA Ab: <1 IU/mL

## 2019-11-19 LAB — ANTI-SCLERODERMA ANTIBODY: Scleroderma (Scl-70) (ENA) Antibody, IgG: <1 AI

## 2019-11-19 LAB — ANA: Anti Nuclear Antibody (ANA): POSITIVE — AB

## 2019-11-19 LAB — C-REACTIVE PROTEIN: CRP: 0.3 mg/L (ref <8.0)

## 2019-11-19 NOTE — Telephone Encounter (Signed)
Patient message response attached to result note for labs assigned to [place].

## 2019-11-19 NOTE — Telephone Encounter (Signed)
Left message for the pt to call back.

## 2019-11-19 NOTE — Telephone Encounter (Signed)
Patient does not want to schedule any further testing done unless its absolutely necessary. She wants to wait to see what her Rheumatologist says before she schedules any other tests

## 2019-12-05 NOTE — Progress Notes (Signed)
Office Visit Note  Patient: [name]             Date of Birth: 11/17/1950           MRN: [id number]             PCP: Ronita Hipps, MD Referring: Ronita Hipps, MD Visit Date: 12/06/2019  Subjective:   History of Present Illness: Angela Ortiz is a 69 y.o. female here for follow up of skin rashes, finger discoloration, and positive ANA. After her initial visit 3 weeks ago she was found to have negative ENA tests and ANA pattern showing DFS which is usually seen without any connective tissue disease.  Since her last visit symptoms have continued with faint rash pretty diffusely that is mildly itchy as well as significant discoloration of the fingers and toes.  She describes her discoloration in a mostly itchy sensation but also that they become numb periods of time.  She finds rewarming her hands and feet improves the symptoms.  She occasionally has very severe pain at night or early morning that her husband describes as wakes her up screaming.  She does continue to have fairly generalized myalgias and arthralgias without any focal joint swelling or changes.  Otherwise she denies any new significant changes in symptoms.  She was accompanied by her husband at this visit.   Review of Systems  Constitutional: Negative for fatigue.  HENT: Positive for mouth sores and mouth dryness. Negative for nose dryness.   Eyes: Positive for pain, itching and visual disturbance. Negative for dryness.  Respiratory: Positive for shortness of breath. Negative for cough, hemoptysis and difficulty breathing.   Cardiovascular: Positive for chest pain, palpitations and swelling in legs/feet.  Gastrointestinal: Negative for abdominal pain, blood in stool, constipation and diarrhea.  Endocrine: Negative for increased urination.  Genitourinary: Negative for painful urination.  Musculoskeletal: Positive for arthralgias, joint pain, joint swelling, myalgias, muscle weakness, morning stiffness, muscle  tenderness and myalgias.  Skin: Positive for color change. Negative for rash and redness.  Allergic/Immunologic: Negative for susceptible to infections.  Neurological: Positive for dizziness, numbness, headaches and weakness. Negative for memory loss.  Hematological: Negative for swollen glands.  Psychiatric/Behavioral: Positive for depressed mood and sleep disturbance. Negative for confusion. The patient is nervous/anxious.     PMFS History:  Patient Active Problem List   Diagnosis Date Noted  . Headache disorder 11/15/2019  . Rash and other nonspecific skin eruption 11/15/2019  . Arthralgia 11/15/2019  . Osteoarthritis of knees, bilateral 11/15/2019  . Raynaud phenomenon 11/15/2019  . Shortness of breath 10/05/2019  . Abnormal EKG 10/05/2019  . Essential hypertension 10/05/2019  . Mixed hyperlipidemia 10/05/2019  . Nuclear sclerosis of both eyes 05/07/2017  . Vitreous floaters of both eyes 05/07/2017  . Fibromyalgia 04/17/2017  . Unspecified convulsions ([place]) 06/14/2014  . Bipolar affective disorder, manic, severe ([place]) 06/08/2014  . Encounter for preadmission testing   . Mania ([place])   . Anxiety 05/18/2014  . Panic disorder 05/18/2014  . Insomnia 05/18/2014    Past Medical History:  Diagnosis Date  . Allergy    seasonal  . Anxiety   . Bipolar disorder ([place])   . 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 Chronic headache   . Depression   . Elevated cholesterol   . Fibromyalgia   . Hypertension     Family History  Problem Relation Age of Onset  . Diabetes Father   . Heart disease  Father   . Cancer Mother   . Osteoarthritis Mother   . Breast cancer Neg Hx   . Colon cancer Neg Hx   . Esophageal cancer Neg Hx   . Colon polyps Neg Hx   . Rectal cancer Neg Hx   . Stomach cancer Neg Hx    Past Surgical History:  Procedure Laterality Date  . ABDOMINAL HYSTERECTOMY    . AUGMENTATION MAMMAPLASTY Bilateral    removed april 2019  . BREAST ENHANCEMENT SURGERY    .  BREAST IMPLANT REMOVAL Bilateral 05/09/2017   Procedure: BILATERAL  REMOVAL BREAST IMPLANTS;  Surgeon: Irene Limbo, MD;  Location: [place];  Service: Plastics;  Laterality: Bilateral;  . CAPSULECTOMY Left 05/09/2017   Procedure: CAPSULECTOMY AND REMOVAL OF IMPLANT MATERIAL ON LEFT;  Surgeon: Irene Limbo, MD;  Location: [facility];  Service: Plastics;  Laterality: Left;  . ESOPHAGOGASTRODUODENOSCOPY     over 30 years ago  . explants Bilateral   . right arm surgery    . ULNA OSTEOTOMY  1992   partical   Social History   Social History Narrative  . Not on file   Immunization History  Administered Date(s) Administered  . Moderna SARS-COVID-2 Vaccination 03/18/2019, 04/15/2019, 11/29/2019     Objective: Vital Signs: BP 114/66 (BP Location: Left Arm, Patient Position: Sitting, Cuff Size: Small)   Pulse 69   Ht 5\' 6"  (1.676 m)   Wt 156 lb 9.6 oz (71 kg)   BMI 25.28 kg/m    Physical Exam HENT:     Right Ear: External ear normal.     Left Ear: External ear normal.  Cardiovascular:     Rate and Rhythm: Normal rate and regular rhythm.  Skin:    General: Skin is warm and dry.     Comments: Very faint diffuse patchy erythema throughout both upper extremities Erythema overlying the distal phalanx of fingers on both hands with several areas of skin peeling but no pitting or ulcers Right foot second through fourth toes are red-violet colored with no overlying ulcers or lesions  Neurological:     Mental Status: She is alert.     Musculoskeletal Exam:  Wrist, fingers full range of motion no tenderness or swelling Knees, ankles, MTPs full range of motion no tenderness or swelling  CDAI Exam: CDAI Score: -- Patient Global: --; Provider Global: -- Swollen: --; Tender: -- Joint Exam 12/06/2019   No joint exam has been documented for this visit   There is currently no information documented on the homunculus. Go to the Rheumatology activity  and complete the homunculus joint exam.  Investigation: No additional findings.  Imaging: No results found.  Recent Labs: Lab Results  Component Value Date   WBC 7.3 08/17/2019   HGB 12.8 08/17/2019   PLT 323.0 08/17/2019   NA 138 08/17/2019   K 4.0 08/17/2019   CL 99 08/17/2019   CO2 31 08/17/2019   GLUCOSE 102 (H) 08/17/2019   BUN 8 08/17/2019   CREATININE 0.88 08/17/2019   BILITOT 0.5 08/17/2019   ALKPHOS 68 08/17/2019   AST 17 08/17/2019   ALT 13 08/17/2019   PROT 7.0 08/17/2019   ALBUMIN 4.7 08/17/2019   CALCIUM 10.0 08/17/2019   GFRAA >60 06/07/2014    Speciality Comments: No specialty comments available.  Procedures:  No procedures performed Allergies: Chlorhexidine   Assessment / Plan:     Visit Diagnoses: Raynaud's phenomenon without gangrene - Plan: amLODipine (NORVASC) 5 MG tablet, triamcinolone  cream (KENALOG) 0.1 % [place]  Symptoms seem consistent with chilblains/pernio with repeated episodes of worsening pain and discoloration that is increased with the cold exposure and improves with warming.  However in between attacks there is persistent erythema and itching sensory changes throughout her fingers and toes.  Moderate amount of skin peeling occurring over the fingertips but no involvement on the toes and no serious ulcers.  Review of medications includes amantadine which can be associated with worsening peripheral vasoconstriction.  Discussed treatment options first-line would include maintaining warm temperature, medication management, calcium channel blocker, and topical steroids. Discussed keeping warm core temperature and warm extremities Recommended stop or decrease amantadine as tolerable Start amlodipine 5 mg once daily,  Start 0.1% triamcinolone twice daily as needed for symptom relief particularly itching We will plan to follow-up in 4 weeks to see response to treatments  Essential Hypertension  Recommended decrease valsartan dose to 80 mg to  reduce risk of inducing hypotension with starting a calcium channel blocker  Orders: No orders of the defined types were placed in this encounter.  Meds ordered this encounter  Medications  . amLODipine (NORVASC) 5 MG tablet    Sig: Take 1 tablet (5 mg total) by mouth daily.    Dispense:  30 tablet    Refill:  0  . triamcinolone cream (KENALOG) 0.1 %    Sig: Apply 1 application topically 2 (two) times daily.    Dispense:  30 g    Refill:  1    Follow-Up Instructions: Return in about 4 weeks (around 01/03/2020).   Collier Salina, MD  Note - This record has been created using [employer].  Chart creation errors have been sought, but may not always  have been located. Such creation errors do not reflect on  the standard of medical care.

## 2019-12-06 ENCOUNTER — Encounter: Payer: Self-pay | Admitting: Internal Medicine

## 2019-12-06 ENCOUNTER — Other Ambulatory Visit: Payer: Self-pay

## 2019-12-06 ENCOUNTER — Ambulatory Visit (INDEPENDENT_AMBULATORY_CARE_PROVIDER_SITE_OTHER): Payer: Medicare Other | Admitting: Internal Medicine

## 2019-12-06 VITALS — BP 114/66 | HR 69 | Ht 66.0 in | Wt 156.6 lb

## 2019-12-06 DIAGNOSIS — R21 Rash and other nonspecific skin eruption: Secondary | ICD-10-CM

## 2019-12-06 DIAGNOSIS — I10 Essential (primary) hypertension: Secondary | ICD-10-CM

## 2019-12-06 DIAGNOSIS — I7300 Raynaud's syndrome without gangrene: Secondary | ICD-10-CM | POA: Diagnosis not present

## 2019-12-06 MED ORDER — TRIAMCINOLONE ACETONIDE 0.1 % EX CREA: 1.0000 "application " | TOPICAL_CREAM | Freq: Two times a day (BID) | CUTANEOUS | 1 refills | Status: DC

## 2019-12-06 MED ORDER — AMLODIPINE BESYLATE 5 MG PO TABS: 5.0000 mg | ORAL_TABLET | Freq: Every day | ORAL | 0 refills | Status: DC

## 2019-12-06 NOTE — Patient Instructions (Addendum)
The symptoms in your hands and feet are most likely coming from inadequate blood flow leading to some ischemia and inflammation at the very small level.  This can be associated problem such as the Raynaud's phenomenon or when he gives persistent redness or ulcers could be described as the chilblains or pernio.  The #1 step is to try and avoid hands or feet becoming very cold by staying in warm environments or using good foot and hand wear as well as maintaining a warm core body temperature.  On review of your medications the amantadine has been a cause of blood vessel constriction that can affect the skin and the fingers and toes on the extremities especially when combined with other things like cold exposure or stress.  Recommend trying to stop or at least reduce this medication may benefit your symptom severity.  Calcium channel blockers are type of medication most often used to treat high blood pressure but can help with dilating the blood vessels.  I recommend decreasing your dose of valsartan by half and starting low-dose amlodipine 5 mg once daily for this.  Side effects of this medicine may include becoming lightheaded especially with standing up which may be from too low of blood pressure, increase fluid swelling in the lower extremities, or worsening of acid reflux symptoms.  Also prescribed a topical triamcinolone steroid which she can use on the hands and feet up to twice daily to try to reduce inflammation and help with itching.

## 2019-12-10 ENCOUNTER — Telehealth: Payer: Self-pay

## 2019-12-10 NOTE — Telephone Encounter (Signed)
Patient called stating she cut her Valsartan tablet in 1/2 and has been taking her Amlodipine, but still has cold sensation and a general overall feeling of being unwell.  Patient states she took her blood pressure this morning and it was 176/75.  Patient is requesting a return call to let her know if this needs to be addressed before the weekend.

## 2019-12-10 NOTE — Telephone Encounter (Signed)
Please advise 

## 2019-12-10 NOTE — Telephone Encounter (Signed)
Spoke with Angela Ortiz symptoms seem similar as at our visit although blood pressure is significantly increased. I did recommend her to reduce valsartan dose partway while starting a calcium channel blocker but not sure that explains changes. She already spoke with her primary care clinic and is seeing them today to address this change. Possibly she is not tolerating the medication or needed to stay on both at previous dose not sure. I will plan to contact her again next week to follow up. She was agreeable with this plan.

## 2019-12-13 NOTE — Telephone Encounter (Signed)
FYI

## 2019-12-23 ENCOUNTER — Telehealth: Payer: Self-pay | Admitting: Gastroenterology

## 2019-12-23 NOTE — Telephone Encounter (Signed)
Pt states that she has been experiencing pain in the right lower side of her abdomen. She wants to know if it is related to the liver lesion and if it should be checked.

## 2019-12-23 NOTE — Telephone Encounter (Signed)
Spoke to patient who reports lower right pain around her hip. She was concerned that the pain may be related to the liver lesion noted on CT and [place] taken this past year. We discussed that the liver lesion AKA benign hemangioma should not be causing the type of pain she describes. Being near her hip and the achy feeling she has its suspected to be more musculoskeletal related. She will try OTC analgesics and contact her PCP if the pain persists.She was also offered an office visit with [place]. She said she will discuss with her husband and get back with [place]. All questions answered. Patient voiced understanding.

## 2020-01-05 ENCOUNTER — Ambulatory Visit: Payer: Medicare Other | Admitting: Cardiology

## 2020-01-10 ENCOUNTER — Other Ambulatory Visit: Payer: Self-pay

## 2020-01-10 ENCOUNTER — Ambulatory Visit (INDEPENDENT_AMBULATORY_CARE_PROVIDER_SITE_OTHER): Payer: Medicare Other | Admitting: Internal Medicine

## 2020-01-10 ENCOUNTER — Encounter: Payer: Self-pay | Admitting: Internal Medicine

## 2020-01-10 VITALS — BP 101/62 | HR 69 | Ht 66.0 in | Wt 155.2 lb

## 2020-01-10 DIAGNOSIS — I7300 Raynaud's syndrome without gangrene: Secondary | ICD-10-CM

## 2020-01-10 DIAGNOSIS — T691XXD Chilblains, subsequent encounter: Secondary | ICD-10-CM

## 2020-01-10 DIAGNOSIS — R21 Rash and other nonspecific skin eruption: Secondary | ICD-10-CM

## 2020-01-10 NOTE — Progress Notes (Signed)
Office Visit Note  Patient: Angela Ortiz             Date of Birth: [birth year]           MRN: [id number]             PCP: Ronita Hipps, MD Referring: Ronita Hipps, MD Visit Date: 01/10/2020   Subjective:   History of Present Illness: Angela Ortiz is a 69 y.o. female here for follow up of raynaud's phenomenon and skin rashes. After her last visit 1  Month ago we discussed starting a low dose amlodipine for her raynaud's phenomenon. She had persistent erythematous changes of the digits suggestive of chilblains so also recommended use of topical steroids. She called to report feeling poorly after starting the medication and decreaing other antihypertensive by half so recommended she follow up with PCP office. She changed to taking her two antihypertensives at different time of day with more stable blood pressures since this change. She also discontinued amantadine and restless leg syndrome has been tolerable. About 2 weeks ago she experienced purple discoloration once limited to the distal joint of a finger and once limited to the palmar aspect over one PIP joint. These each improved on their own after 2-3 days and were nonpainful and without sensory change. She also describes sensation of flushing of the skin over her face and trunk and a sense as if small particles or air were rubbing against her skin. These last a few minutes at a time and she has not noticed a particular trigger for these.   Review of Systems  Constitutional: Positive for fatigue.  HENT: Positive for mouth sores. Negative for mouth dryness and nose dryness.   Eyes: Negative for pain, itching, visual disturbance and dryness.  Respiratory: Positive for shortness of breath. Negative for cough, hemoptysis and difficulty breathing.   Cardiovascular: Positive for swelling in legs/feet. Negative for chest pain and palpitations.  Gastrointestinal: Positive for constipation. Negative for abdominal pain, blood in  stool and diarrhea.  Endocrine: Negative for increased urination.  Genitourinary: Negative for painful urination.  Musculoskeletal: Positive for arthralgias, joint pain, joint swelling, myalgias, morning stiffness, muscle tenderness and myalgias. Negative for muscle weakness.  Skin: Positive for color change, rash and redness.  Allergic/Immunologic: Negative for susceptible to infections.  Neurological: Positive for dizziness, light-headedness, headaches and weakness. Negative for numbness and memory loss.  Hematological: Positive for swollen glands.  Psychiatric/Behavioral: Negative for confusion and sleep disturbance.    PMFS History:  Patient Active Problem List   Diagnosis Date Noted  . Chilblains 01/11/2020  . Headache disorder 11/15/2019  . Rash and other nonspecific skin eruption 11/15/2019  . Arthralgia 11/15/2019  . Osteoarthritis of knees, bilateral 11/15/2019  . Raynaud phenomenon 11/15/2019  . Shortness of breath 10/05/2019  . Abnormal EKG 10/05/2019  . Essential hypertension 10/05/2019  . Mixed hyperlipidemia 10/05/2019  . Nuclear sclerosis of both eyes 05/07/2017  . Vitreous floaters of both eyes 05/07/2017  . Fibromyalgia 04/17/2017  . Unspecified convulsions ([place]) 06/14/2014  . Bipolar affective disorder, manic, severe ([place]) 06/08/2014  . Encounter for preadmission testing   . Mania ([place])   . Anxiety 05/18/2014  . Panic disorder 05/18/2014  . Insomnia 05/18/2014    Past Medical History:  Diagnosis Date  . Allergy    seasonal  . Anxiety   . Bipolar disorder ([place])   . 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 Chronic headache   .  Depression   . Elevated cholesterol   . Fibromyalgia   . Hypertension     Family History  Problem Relation Age of Onset  . Diabetes Father   . Heart disease Father   . Cancer Mother   . Osteoarthritis Mother   . Breast cancer Neg Hx   . Colon cancer Neg Hx   . Esophageal cancer Neg Hx   . Colon polyps Neg Hx   .  Rectal cancer Neg Hx   . Stomach cancer Neg Hx    Past Surgical History:  Procedure Laterality Date  . ABDOMINAL HYSTERECTOMY    . AUGMENTATION MAMMAPLASTY Bilateral    removed april 2019  . BREAST ENHANCEMENT SURGERY    . BREAST IMPLANT REMOVAL Bilateral 05/09/2017   Procedure: BILATERAL  REMOVAL BREAST IMPLANTS;  Surgeon: Irene Limbo, MD;  Location: [facility];  Service: Plastics;  Laterality: Bilateral;  . CAPSULECTOMY Left 05/09/2017   Procedure: CAPSULECTOMY AND REMOVAL OF IMPLANT MATERIAL ON LEFT;  Surgeon: Irene Limbo, MD;  Location: [facility];  Service: Plastics;  Laterality: Left;  . ESOPHAGOGASTRODUODENOSCOPY     over 30 years ago  . explants Bilateral   . right arm surgery    . ULNA OSTEOTOMY  1992   partical   Social History   Social History Narrative  . Not on file   Immunization History  Administered Date(s) Administered  . Moderna SARS-COVID-2 Vaccination 03/18/2019, 04/15/2019, 11/29/2019     Objective: Vital Signs: BP 101/62 (BP Location: Right Arm, Patient Position: Sitting, Cuff Size: Small)   Pulse 69   Ht 5\' 6"  (1.676 m)   Wt 155 lb 3.2 oz (70.4 kg)   BMI 25.05 kg/m    Physical Exam HENT:     Right Ear: External ear normal.     Left Ear: External ear normal.  Eyes:     Conjunctiva/sclera: Conjunctivae normal.  Cardiovascular:     Rate and Rhythm: Normal rate and regular rhythm.  Pulmonary:     Effort: Pulmonary effort is normal.     Breath sounds: Normal breath sounds.  Skin:    General: Skin is warm and dry.     Findings: No rash.     Comments: Blanching erythema from DIP to tip of fingers and toes, without any skin lesions or nail pitting  Neurological:     Mental Status: She is alert.     Musculoskeletal Exam: Wrist, fingers full range of motion no swelling Knees, ankles, MTPs full range of motion no swelling   Investigation: No additional findings.  Imaging: No results found.  Recent  Labs: Lab Results  Component Value Date   WBC 7.3 08/17/2019   HGB 12.8 08/17/2019   PLT 323.0 08/17/2019   NA 138 08/17/2019   K 4.0 08/17/2019   CL 99 08/17/2019   CO2 31 08/17/2019   GLUCOSE 102 (H) 08/17/2019   BUN 8 08/17/2019   CREATININE 0.88 08/17/2019   BILITOT 0.5 08/17/2019   ALKPHOS 68 08/17/2019   AST 17 08/17/2019   ALT 13 08/17/2019   PROT 7.0 08/17/2019   ALBUMIN 4.7 08/17/2019   CALCIUM 10.0 08/17/2019   GFRAA >60 06/07/2014    Speciality Comments: No specialty comments available.  Procedures:  No procedures performed Allergies: Chlorhexidine   Assessment / Plan:     Visit Diagnoses: Raynaud's phenomenon without gangrene Chilblains, subsequent encounter - Plan: amLODipine (NORVASC) 5 MG tablet, triamcinolone (KENALOG) 0.1 %  Her finger discoloration is noticeably improved  on exam compared to last visit. Started amlodipine 5mg  and PRN triamcinolone and stopped amantadine. Skin peeling is healed and violaceous appearing areas are now erythematous but warm to touch. This is less than 100% improved but is significantly better and now mild enough I recommend against starting more medicine unless symptoms became worse. Will continue current medication and f/u in 3 months unless PRN sooner.  Rash and other nonspecific skin eruption - Plan: amLODipine (NORVASC) 5 MG tablet, triamcinolone (KENALOG) 0.1 %  I am not sure the cause of her 2 episodes of finger purple discoloration that lasted 2-3 days at a time with spontaneous resolution. These do not look like hematoma. I do not see any evidence of residual damage so recommended just observing for now no urgency to address unless becoming painful or lesions that are not self limited.  Orders: No orders of the defined types were placed in this encounter.  Meds ordered this encounter  Medications  . amLODipine (NORVASC) 5 MG tablet    Sig: Take 1 tablet (5 mg total) by mouth daily.    Dispense:  90 tablet    Refill:   0  . triamcinolone (KENALOG) 0.1 %    Sig: Apply 1 application topically as needed.    Dispense:  30 g    Refill:  2    Follow-Up Instructions: Return in about 3 months (around 04/09/2020) for Raynaud's.   Collier Salina, MD  Note - This record has been created using [employer].  Chart creation errors have been sought, but may not always  have been located. Such creation errors do not reflect on  the standard of medical care.

## 2020-01-11 DIAGNOSIS — T691XXA Chilblains, initial encounter: Secondary | ICD-10-CM | POA: Insufficient documentation

## 2020-01-11 MED ORDER — AMLODIPINE BESYLATE 5 MG PO TABS: 5.0000 mg | ORAL_TABLET | Freq: Every day | ORAL | 0 refills | Status: DC

## 2020-01-11 MED ORDER — TRIAMCINOLONE ACETONIDE 0.1 % EX CREA: 1.0000 "application " | TOPICAL_CREAM | CUTANEOUS | 2 refills | Status: DC | PRN

## 2020-01-13 ENCOUNTER — Encounter: Payer: Self-pay | Admitting: Cardiology

## 2020-01-19 NOTE — Addendum Note (Signed)
Addended by: Truddie Hidden on: 01/19/2020 11:25 AM   Modules accepted: Orders

## 2020-02-15 ENCOUNTER — Other Ambulatory Visit: Payer: Self-pay | Admitting: Orthopedic Surgery

## 2020-02-15 DIAGNOSIS — M6740 Ganglion, unspecified site: Secondary | ICD-10-CM

## 2020-02-24 ENCOUNTER — Other Ambulatory Visit: Payer: Self-pay

## 2020-02-24 ENCOUNTER — Other Ambulatory Visit: Payer: Self-pay | Admitting: Orthopedic Surgery

## 2020-02-24 ENCOUNTER — Ambulatory Visit (INDEPENDENT_AMBULATORY_CARE_PROVIDER_SITE_OTHER): Payer: Medicare Other

## 2020-02-24 DIAGNOSIS — R0602 Shortness of breath: Secondary | ICD-10-CM | POA: Diagnosis not present

## 2020-02-24 LAB — ECHOCARDIOGRAM COMPLETE
Area-P 1/2: 3.17 cm2
S' Lateral: 2.4 cm

## 2020-02-24 NOTE — Progress Notes (Signed)
Complete echocardiogram performed.  Jimmy Rosiland Sen RDCS, RVT  

## 2020-02-29 ENCOUNTER — Telehealth: Payer: Self-pay

## 2020-02-29 NOTE — Telephone Encounter (Signed)
   [facility] Pre-operative Risk Assessment    HEARTCARE STAFF: - Please ensure there is not already an duplicate clearance open for this procedure. - Under Visit Info/Reason for Call, type in Other and utilize the format Clearance MM/DD/YY or Clearance TBD. Do not use dashes or single digits. - If request is for dental extraction, please clarify the # of teeth to be extracted.  Request for surgical clearance:  1. What type of surgery is being performed? Excision recurrent ganglion left wrist     2. When is this surgery scheduled? 03/21/2020   3. What type of clearance is required (medical clearance vs. Pharmacy clearance to hold med vs. Both)? Medical  4. Are there any medications that need to be held prior to surgery and how long? No   5. Practice name and name of physician performing surgery? [facility]/ Dr. [name]   6. What is the office phone number? [phone]    3.   What is the office fax number? [phone]  8.   Anesthesia type (None, local, MAC, general) ? Choice   Angela Ortiz July 02/29/2020, 10:58 AM  _________________________________________________________________   (provider comments below)

## 2020-03-03 ENCOUNTER — Ambulatory Visit
Admission: RE | Admit: 2020-03-03 | Discharge: 2020-03-03 | Disposition: A | Discharge status: Home / Self Care | Payer: Medicare Other | Source: Ambulatory Visit | Attending: Orthopedic Surgery | Admitting: Orthopedic Surgery

## 2020-03-03 DIAGNOSIS — M6740 Ganglion, unspecified site: Secondary | ICD-10-CM

## 2020-03-13 ENCOUNTER — Other Ambulatory Visit: Payer: Self-pay

## 2020-03-13 ENCOUNTER — Encounter (HOSPITAL_BASED_OUTPATIENT_CLINIC_OR_DEPARTMENT_OTHER): Payer: Self-pay | Admitting: Orthopedic Surgery

## 2020-03-17 ENCOUNTER — Other Ambulatory Visit (HOSPITAL_COMMUNITY)
Admission: RE | Admit: 2020-03-17 | Discharge: 2020-03-17 | Disposition: A | Discharge status: Home / Self Care | Payer: Medicare Other | Source: Ambulatory Visit | Attending: Orthopedic Surgery | Admitting: Orthopedic Surgery

## 2020-03-17 DIAGNOSIS — Z20822 Contact with and (suspected) exposure to covid-19: Secondary | ICD-10-CM | POA: Insufficient documentation

## 2020-03-17 DIAGNOSIS — Z01812 Encounter for preprocedural laboratory examination: Secondary | ICD-10-CM | POA: Insufficient documentation

## 2020-03-17 LAB — SARS CORONAVIRUS 2 (TAT 6-24 HRS): SARS Coronavirus 2: NEGATIVE

## 2020-03-17 NOTE — Telephone Encounter (Signed)
Please advise and forward back to requesting party at [phone]. Procedure is scheduled for 03/21/20. If additional questions, contact Dr. Levell July office at [phone].

## 2020-03-17 NOTE — Telephone Encounter (Signed)
Pt has been made aware she has been cleared for her surgery. Pt states that [facility] dropped the ball on her clearance. I explained I was not sure what happened however she has been cleared and clearance has been sent to the surgeon's office. Pt thanked me for the call.

## 2020-03-17 NOTE — Telephone Encounter (Addendum)
   Primary Cardiologist: Berniece Salines, DO  Chart reviewed as part of pre-operative protocol coverage. Patient was contacted 03/17/2020 in reference to pre-operative risk assessment for pending surgery as outlined below.  Angela Ortiz was last seen on 09/2019 by Dr. Harriet Masson with h/o HTN, HLD, bipolar disorder. At that visit she was evaluated for multiple symptoms including discoloration of her hands and feet and dyspnea on exertion. At that visit, echocardiogram and stress test were recommended.  Her echo showed EF 60-65%, grade 1 DD, normal RV, otherwise no acute findings.It appears she called in in October declining to do any further testing. She states she did not believe it was necessary. F/u was recommended in 3 months but do not see attended this. Unfortunately it appears the original clearance below was not routed to the preop box on the day it was received so we apologize for the delay.I reached out to patient for update on how she is doing. The patient affirms she has been doing well without any new cardiac symptoms. She denies any further SOB. No chest pain. She is able to exert 7.34 METS on the DASI without any cardiac limitation. She doesn't do the higher exertion activities simply because she doesn't want to, not because of any specific cardiac limitation. She practices yoga.  She was upset at the late nature of the call given how close her surgery is and I explained that we identified the delay issue and apologize. She states "I am having the surgery regardless and you can tell Dr. Harriet Masson that." I confirmed with Dr. Harriet Masson since she is asymptomatic from a cardiac standpoint, based on ACC/AHA guidelines, the patient would be at acceptable risk for the planned procedure without further cardiovascular testing. I'll route to callback team to call her back to let her know.  The patient was advised that if she develops new symptoms prior to surgery to contact our office to arrange for a follow-up visit,  and she verbalized understanding.  I will route this recommendation to the requesting party via Epic fax function and remove from pre-op pool. Please call with questions.  Charlie Pitter, PA-C 03/17/2020, 3:38 PM

## 2020-03-17 NOTE — Progress Notes (Signed)

## 2020-03-21 ENCOUNTER — Ambulatory Visit (HOSPITAL_BASED_OUTPATIENT_CLINIC_OR_DEPARTMENT_OTHER): Payer: Medicare Other | Admitting: Anesthesiology

## 2020-03-21 ENCOUNTER — Encounter (HOSPITAL_BASED_OUTPATIENT_CLINIC_OR_DEPARTMENT_OTHER): Payer: Self-pay | Admitting: Orthopedic Surgery

## 2020-03-21 ENCOUNTER — Other Ambulatory Visit: Payer: Self-pay

## 2020-03-21 ENCOUNTER — Ambulatory Visit (HOSPITAL_BASED_OUTPATIENT_CLINIC_OR_DEPARTMENT_OTHER)
Admission: RE | Admit: 2020-03-21 | Discharge: 2020-03-21 | Disposition: A | Discharge status: Home / Self Care | Payer: Medicare Other | Attending: Orthopedic Surgery | Admitting: Orthopedic Surgery

## 2020-03-21 ENCOUNTER — Encounter (HOSPITAL_BASED_OUTPATIENT_CLINIC_OR_DEPARTMENT_OTHER)
Admission: RE | Disposition: A | Discharge status: Home / Self Care | Payer: Self-pay | Source: Home / Self Care | Attending: Orthopedic Surgery

## 2020-03-21 DIAGNOSIS — Z79899 Other long term (current) drug therapy: Secondary | ICD-10-CM | POA: Insufficient documentation

## 2020-03-21 DIAGNOSIS — Z79890 Hormone replacement therapy (postmenopausal): Secondary | ICD-10-CM | POA: Diagnosis not present

## 2020-03-21 DIAGNOSIS — M67432 Ganglion, left wrist: Secondary | ICD-10-CM | POA: Insufficient documentation

## 2020-03-21 DIAGNOSIS — Z888 Allergy status to other drugs, medicaments and biological substances status: Secondary | ICD-10-CM | POA: Diagnosis not present

## 2020-03-21 HISTORY — PX: GANGLION CYST EXCISION: SHX1691

## 2020-03-21 SURGERY — EXCISION, GANGLION CYST, WRIST
Anesthesia: General | Site: Wrist | Laterality: Left

## 2020-03-21 MED ORDER — LACTATED RINGERS IV SOLN: INTRAVENOUS | Status: DC | PRN

## 2020-03-21 MED ORDER — MIDAZOLAM HCL 2 MG/2ML IJ SOLN
INTRAMUSCULAR | Status: DC | PRN
  Administered 2020-03-21: 2 mg via INTRAVENOUS

## 2020-03-21 MED ORDER — KETOROLAC TROMETHAMINE 30 MG/ML IJ SOLN
INTRAMUSCULAR | Status: AC
  Filled 2020-03-21: qty 1

## 2020-03-21 MED ORDER — MIDAZOLAM HCL 2 MG/2ML IJ SOLN
INTRAMUSCULAR | Status: AC
  Filled 2020-03-21: qty 2

## 2020-03-21 MED ORDER — OXYCODONE HCL 5 MG PO TABS
ORAL_TABLET | ORAL | Status: AC
  Filled 2020-03-21: qty 1

## 2020-03-21 MED ORDER — OXYCODONE HCL 5 MG/5ML PO SOLN: 5.0000 mg | Freq: Once | ORAL | Status: AC | PRN

## 2020-03-21 MED ORDER — CEFAZOLIN SODIUM-DEXTROSE 2-3 GM-%(50ML) IV SOLR
INTRAVENOUS | Status: DC | PRN
  Administered 2020-03-21: 2 g via INTRAVENOUS

## 2020-03-21 MED ORDER — PROPOFOL 10 MG/ML IV BOLUS
INTRAVENOUS | Status: DC | PRN
Start: 2020-03-21 — End: 2020-03-21
  Administered 2020-03-21: 150 mg via INTRAVENOUS

## 2020-03-21 MED ORDER — LIDOCAINE HCL (CARDIAC) PF 100 MG/5ML IV SOSY
PREFILLED_SYRINGE | INTRAVENOUS | Status: DC | PRN
  Administered 2020-03-21: 60 mg via INTRAVENOUS

## 2020-03-21 MED ORDER — OXYCODONE HCL 5 MG PO TABS
5.0000 mg | ORAL_TABLET | Freq: Once | ORAL | Status: AC | PRN
  Administered 2020-03-21: 5 mg via ORAL

## 2020-03-21 MED ORDER — AMISULPRIDE (ANTIEMETIC) 5 MG/2ML IV SOLN: 10.0000 mg | Freq: Once | INTRAVENOUS | Status: DC | PRN

## 2020-03-21 MED ORDER — HYDROCODONE-ACETAMINOPHEN 5-325 MG PO TABS: ORAL_TABLET | ORAL | 0 refills | Status: DC

## 2020-03-21 MED ORDER — FENTANYL CITRATE (PF) 100 MCG/2ML IJ SOLN
INTRAMUSCULAR | Status: AC
  Filled 2020-03-21: qty 2

## 2020-03-21 MED ORDER — LACTATED RINGERS IV SOLN: INTRAVENOUS | Status: DC

## 2020-03-21 MED ORDER — 0.9 % SODIUM CHLORIDE (POUR BTL) OPTIME
TOPICAL | Status: DC | PRN
  Administered 2020-03-21: 1000 mL

## 2020-03-21 MED ORDER — KETOROLAC TROMETHAMINE 30 MG/ML IJ SOLN
30.0000 mg | Freq: Once | INTRAMUSCULAR | Status: AC
  Administered 2020-03-21: 30 mg via INTRAVENOUS

## 2020-03-21 MED ORDER — CEFAZOLIN SODIUM-DEXTROSE 2-4 GM/100ML-% IV SOLN
INTRAVENOUS | Status: AC
  Filled 2020-03-21: qty 100

## 2020-03-21 MED ORDER — FENTANYL CITRATE (PF) 100 MCG/2ML IJ SOLN
INTRAMUSCULAR | Status: DC | PRN
  Administered 2020-03-21: 50 ug via INTRAVENOUS

## 2020-03-21 MED ORDER — ACETAMINOPHEN 500 MG PO TABS
1000.0000 mg | ORAL_TABLET | Freq: Once | ORAL | Status: AC
  Administered 2020-03-21: 1000 mg via ORAL

## 2020-03-21 MED ORDER — HYDROMORPHONE HCL 1 MG/ML IJ SOLN
INTRAMUSCULAR | Status: AC
  Filled 2020-03-21: qty 0.5

## 2020-03-21 MED ORDER — DEXAMETHASONE SODIUM PHOSPHATE 10 MG/ML IJ SOLN
INTRAMUSCULAR | Status: DC | PRN
  Administered 2020-03-21: 5 mg via INTRAVENOUS

## 2020-03-21 MED ORDER — BUPIVACAINE HCL (PF) 0.25 % IJ SOLN
INTRAMUSCULAR | Status: AC
  Filled 2020-03-21: qty 60

## 2020-03-21 MED ORDER — ONDANSETRON HCL 4 MG/2ML IJ SOLN
INTRAMUSCULAR | Status: DC | PRN
  Administered 2020-03-21: 4 mg via INTRAVENOUS

## 2020-03-21 MED ORDER — BUPIVACAINE HCL (PF) 0.25 % IJ SOLN
INTRAMUSCULAR | Status: DC | PRN
  Administered 2020-03-21: 4 mL

## 2020-03-21 MED ORDER — HYDROMORPHONE HCL 1 MG/ML IJ SOLN
0.2500 mg | INTRAMUSCULAR | Status: DC | PRN
  Administered 2020-03-21 (×2): 0.5 mg via INTRAVENOUS

## 2020-03-21 MED ORDER — PROPOFOL 10 MG/ML IV BOLUS
INTRAVENOUS | Status: AC
  Filled 2020-03-21: qty 40

## 2020-03-21 MED ORDER — CEFAZOLIN SODIUM-DEXTROSE 2-4 GM/100ML-% IV SOLN: 2.0000 g | INTRAVENOUS | Status: DC

## 2020-03-21 MED ORDER — ACETAMINOPHEN 500 MG PO TABS
ORAL_TABLET | ORAL | Status: AC
  Filled 2020-03-21: qty 2

## 2020-03-21 MED ORDER — ONDANSETRON HCL 4 MG/2ML IJ SOLN
INTRAMUSCULAR | Status: AC
  Filled 2020-03-21: qty 2

## 2020-03-21 MED ORDER — PROMETHAZINE HCL 25 MG/ML IJ SOLN: 6.2500 mg | INTRAMUSCULAR | Status: DC | PRN

## 2020-03-21 MED ORDER — LIDOCAINE 2% (20 MG/ML) 5 ML SYRINGE
INTRAMUSCULAR | Status: AC
  Filled 2020-03-21: qty 5

## 2020-03-21 SURGICAL SUPPLY — 51 items
APL PRP STRL LF DISP 70% ISPRP (MISCELLANEOUS)
APL SKNCLS STERI-STRIP NONHPOA (GAUZE/BANDAGES/DRESSINGS)
BENZOIN TINCTURE PRP APPL 2/3 (GAUZE/BANDAGES/DRESSINGS) IMPLANT
BLADE MINI RND TIP GREEN BEAV (BLADE) IMPLANT
BLADE SURG 15 STRL LF DISP TIS (BLADE) ×2 IMPLANT
BLADE SURG 15 STRL SS (BLADE) ×6
BNDG CMPR 9X4 STRL LF SNTH (GAUZE/BANDAGES/DRESSINGS) ×1
BNDG CONFORM 4 STRL LF (GAUZE/BANDAGES/DRESSINGS) ×3 IMPLANT
BNDG ELASTIC 2X5.8 VLCR STR LF (GAUZE/BANDAGES/DRESSINGS) IMPLANT
BNDG ELASTIC 3X5.8 VLCR STR LF (GAUZE/BANDAGES/DRESSINGS) ×3 IMPLANT
BNDG ESMARK 4X9 LF (GAUZE/BANDAGES/DRESSINGS) ×3 IMPLANT
BNDG GAUZE ELAST 4 BULKY (GAUZE/BANDAGES/DRESSINGS) ×3 IMPLANT
CHLORAPREP W/TINT 26 (MISCELLANEOUS) IMPLANT
CLOSURE WOUND 1/2 X4 (GAUZE/BANDAGES/DRESSINGS)
CORD BIPOLAR FORCEPS 12FT (ELECTRODE) ×3 IMPLANT
COVER BACK TABLE 60X90IN (DRAPES) ×3 IMPLANT
COVER MAYO STAND STRL (DRAPES) ×3 IMPLANT
COVER WAND RF STERILE (DRAPES) IMPLANT
CUFF TOURN SGL QUICK 18X4 (TOURNIQUET CUFF) ×3 IMPLANT
DRAPE EXTREMITY T 121X128X90 (DISPOSABLE) ×3 IMPLANT
DRAPE SURG 17X23 STRL (DRAPES) ×3 IMPLANT
DRSG PAD ABDOMINAL 8X10 ST (GAUZE/BANDAGES/DRESSINGS) ×3 IMPLANT
DURAPREP 26ML APPLICATOR (WOUND CARE) ×3 IMPLANT
GAUZE SPONGE 4X4 12PLY STRL (GAUZE/BANDAGES/DRESSINGS) ×3 IMPLANT
GAUZE SPONGE 4X4 12PLY STRL LF (GAUZE/BANDAGES/DRESSINGS) ×3 IMPLANT
GAUZE XEROFORM 1X8 LF (GAUZE/BANDAGES/DRESSINGS) ×3 IMPLANT
GLOVE SRG 8 PF TXTR STRL LF DI (GLOVE) ×1 IMPLANT
GLOVE SURG ENC MOIS LTX SZ7.5 (GLOVE) ×3 IMPLANT
GLOVE SURG UNDER POLY LF SZ8 (GLOVE) ×3
GOWN STRL REUS W/ TWL LRG LVL3 (GOWN DISPOSABLE) ×1 IMPLANT
GOWN STRL REUS W/TWL LRG LVL3 (GOWN DISPOSABLE) ×3
GOWN STRL REUS W/TWL XL LVL3 (GOWN DISPOSABLE) ×3 IMPLANT
NEEDLE HYPO 25X1 1.5 SAFETY (NEEDLE) ×3 IMPLANT
NS IRRIG 1000ML POUR BTL (IV SOLUTION) ×3 IMPLANT
PACK BASIN DAY SURGERY FS (CUSTOM PROCEDURE TRAY) ×3 IMPLANT
PAD CAST 3X4 CTTN HI CHSV (CAST SUPPLIES) IMPLANT
PADDING CAST ABS 4INX4YD NS (CAST SUPPLIES) ×2
PADDING CAST ABS COTTON 4X4 ST (CAST SUPPLIES) ×1 IMPLANT
PADDING CAST COTTON 3X4 STRL (CAST SUPPLIES)
SPLINT PLASTER CAST XFAST 3X15 (CAST SUPPLIES) IMPLANT
SPLINT PLASTER XTRA FASTSET 3X (CAST SUPPLIES)
STOCKINETTE 4X48 STRL (DRAPES) ×3 IMPLANT
STRIP CLOSURE SKIN 1/2X4 (GAUZE/BANDAGES/DRESSINGS) IMPLANT
SUT ETHILON 9 0 BV100 4 (SUTURE) ×3 IMPLANT
SUT MNCRL AB 4-0 PS2 18 (SUTURE) IMPLANT
SUT MON AB 5-0 PS2 18 (SUTURE) IMPLANT
SUT VIC AB 4-0 P2 18 (SUTURE) IMPLANT
SYR BULB EAR ULCER 3OZ GRN STR (SYRINGE) ×3 IMPLANT
SYR CONTROL 10ML LL (SYRINGE) ×3 IMPLANT
TOWEL GREEN STERILE FF (TOWEL DISPOSABLE) ×6 IMPLANT
UNDERPAD 30X36 HEAVY ABSORB (UNDERPADS AND DIAPERS) ×3 IMPLANT

## 2020-03-21 NOTE — Anesthesia Procedure Notes (Signed)
Procedure Name: LMA Insertion Performed by: Verita Lamb, CRNA Pre-anesthesia Checklist: Patient identified, Emergency Drugs available, Suction available and Patient being monitored Patient Re-evaluated:Patient Re-evaluated prior to induction Oxygen Delivery Method: Circle system utilized Preoxygenation: Pre-oxygenation with 100% oxygen Induction Type: IV induction Ventilation: Mask ventilation without difficulty LMA: LMA inserted LMA Size: 4.0 Number of attempts: 1 Airway Equipment and Method: Bite block Placement Confirmation: positive ETCO2,  CO2 detector and breath sounds checked- equal and bilateral Tube secured with: Tape Dental Injury: Teeth and Oropharynx as per pre-operative assessment

## 2020-03-21 NOTE — Anesthesia Postprocedure Evaluation (Signed)
Anesthesia Post Note  Patient: Angela Ortiz  Procedure(s) Performed: EXCISION RECURRENT GANGLION LEFT WRIST (Left Wrist)     Patient location during evaluation: PACU Anesthesia Type: General Level of consciousness: awake and alert Pain management: pain level controlled Vital Signs Assessment: post-procedure vital signs reviewed and stable Respiratory status: spontaneous breathing, nonlabored ventilation, respiratory function stable and patient connected to nasal cannula oxygen Cardiovascular status: blood pressure returned to baseline and stable Postop Assessment: no apparent nausea or vomiting Anesthetic complications: no   No complications documented.  Last Vitals:  Vitals:   03/21/20 1452 03/21/20 1500  BP: (!) 141/64 (!) 141/72  Pulse: 80 73  Resp: 18 (!) 21  Temp:    SpO2: 100% 100%    Last Pain:  Vitals:   03/21/20 1500  TempSrc:   PainSc: [place]

## 2020-03-21 NOTE — Anesthesia Preprocedure Evaluation (Addendum)
Anesthesia Evaluation  Patient identified by MRN, date of birth, ID band Patient awake    Reviewed: Allergy & Precautions, NPO status , Patient's Chart, lab work & pertinent test results  Airway Mallampati: I  TM Distance: >3 FB Neck ROM: Full    Dental  (+) Dental Advisory Given, Chipped   Pulmonary neg pulmonary ROS,    Pulmonary exam normal breath sounds clear to auscultation       Cardiovascular hypertension, Pt. on medications (-) angina(-) Past MI Normal cardiovascular exam Rhythm:Regular Rate:Normal     Neuro/Psych  Headaches, PSYCHIATRIC DISORDERS Anxiety Depression Bipolar Disorder    GI/Hepatic Neg liver ROS, GERD  Medicated,  Endo/Other  negative endocrine ROS  Renal/GU negative Renal ROS     Musculoskeletal  (+) Arthritis , Osteoarthritis,  Fibromyalgia -  Abdominal   Peds  Hematology negative hematology ROS (+)   Anesthesia Other Findings   Reproductive/Obstetrics                            Anesthesia Physical  Anesthesia Plan  ASA: II  Anesthesia Plan: General   Post-op Pain Management:    Induction: Intravenous  PONV Risk Score and Plan: 3 and Ondansetron, Dexamethasone and Treatment may vary due to age or medical condition  Airway Management Planned: LMA  Additional Equipment:   Intra-op Plan:   Post-operative Plan: Extubation in OR  Informed Consent: I have reviewed the patients History and Physical, chart, labs and discussed the procedure including the risks, benefits and alternatives for the proposed anesthesia with the patient or authorized representative who has indicated his/her understanding and acceptance.       Plan Discussed with: CRNA and Anesthesiologist  Anesthesia Plan Comments:        Anesthesia Quick Evaluation

## 2020-03-21 NOTE — Op Note (Signed)
NAME: Angela Ortiz MEDICAL RECORD NO: [id number] DATE OF BIRTH: 1950/09/14 FACILITY: [facility] LOCATION: [facility] PHYSICIAN: Tennis Must, MD   OPERATIVE REPORT   DATE OF PROCEDURE: 03/21/20    PREOPERATIVE DIAGNOSIS:   Left wrist recurrent volar ganglion cyst   POSTOPERATIVE DIAGNOSIS:   Left wrist recurrent volar ganglion cyst   PROCEDURE:   Excision of recurrent volar ganglion cyst left wrist   SURGEON:  [name], M.D.   ASSISTANT: Daryll Brod, MD   ANESTHESIA:  General   INTRAVENOUS FLUIDS:  Per anesthesia flow sheet.   ESTIMATED BLOOD LOSS:  Minimal.   COMPLICATIONS:  None.   SPECIMENS:   Left wrist ganglion to pathology   TOURNIQUET TIME:    Total Tourniquet Time Documented: Upper Arm (Left) - 34 minutes Total: Upper Arm (Left) - 34 minutes    DISPOSITION:  Stable to PACU.   INDICATIONS: 70 year old female with volar radial wrist mass.  She has had previous ganglion excision 30 years ago.  Ultrasound confirms recurrent ganglion cyst.  She wishes to have this removed. Risks, benefits and alternatives of surgery were discussed including the risks of blood loss, infection, damage to nerves, vessels, tendons, ligaments, bone for surgery, need for additional surgery, complications with wound healing, continued pain, stiffness, recurrence of cyst.  She voiced understanding of these risks and elected to proceed.  OPERATIVE COURSE:  After being identified preoperatively by myself,  the patient and I agreed on the procedure and site of the procedure.  The surgical site was marked.  Surgical consent had been signed. She was given IV antibiotics as preoperative antibiotic prophylaxis. She was transferred to the operating room and placed on the operating table in supine position with the Left upper extremity on an arm board.  General anesthesia was induced by the anesthesiologist.  Left upper extremity was prepped and draped in normal sterile orthopedic  fashion.  A surgical pause was performed between the surgeons, anesthesia, and operating room staff and all were in agreement as to the patient, procedure, and site of procedure.  Tourniquet at the proximal aspect of the extremity was inflated to 250 mmHg after exsanguination of the arm with an Esmarch bandage.    Previous incision was followed.  This is carried in subcutaneous tissues by spreading technique.  There was scar surrounding the mass.  It was carefully freed up.  The incision was extended proximally to identify the radial artery.  The mass was carefully freed up from surrounding tissues and from the artery.  It was densely adherent to the underlying radial artery.  Portion of the mass was left adhered to the artery.  The mass was excised.  There was some bleeding from the artery where it appeared a small branch had torn.  The mass was traced down to the wrist joint.  The stalk was transected.  The mass was sent to pathology for examination.  The source of the stalk was treated with bipolar electrocautery to try to prevent recurrence.  The area on the radial artery where the branch had torn was then closed with a 9-0 nylon suture in a figure-of-eight fashion.  This provided good closure of the tear.  The wound was copiously irrigated with sterile saline.  It was then closed with 4-0 nylon in a horizontal mattress fashion.  Was injected with quarter percent plain Marcaine to aid in postoperative analgesia.  Was then dressed with sterile Xeroform 4 x 4's and ABD and wrapped with Kerlix and  Ace bandage.  The tourniquet was deflated at 34 minutes.  Fingertips were pink with brisk capillary refill after deflation of tourniquet.  The operative  drapes were broken down.  The patient was awoken from anesthesia safely.  She was transferred back to the stretcher and taken to PACU in stable condition.  I will see her back in the office in 1 week for postoperative followup.  I will give her a prescription for Norco  5/325 1-2 tabs PO q6 hours prn pain, dispense # 20.   [name], MD Electronically signed, 03/21/20

## 2020-03-21 NOTE — Transfer of Care (Signed)
Immediate Anesthesia Transfer of Care Note  Patient: Angela Ortiz  Procedure(s) Performed: EXCISION RECURRENT GANGLION LEFT WRIST (Left Wrist)  Patient Location: PACU  Anesthesia Type:General  Level of Consciousness: awake, alert  and oriented  Airway & Oxygen Therapy: Patient Spontanous Breathing and Patient connected to face mask oxygen  Post-op Assessment: Report given to RN and Post -op Vital signs reviewed and stable  Post vital signs: Reviewed and stable  Last Vitals:  Vitals Value Taken Time  BP    Temp    Pulse    Resp    SpO2      Last Pain:  Vitals:   03/21/20 1105  TempSrc: Oral  PainSc: 0-No pain         Complications: No complications documented.

## 2020-03-21 NOTE — Op Note (Signed)
I assisted Surgeon(s) and Role:    * [name], MD - Primary    Daryll Brod, MD - Assisting on the Procedure(s): EXCISION RECURRENT GANGLION LEFT WRIST on 03/21/2020.  I provided assistance on this case as follows: setup, approach, identification, isolation of the cyst, protection of the radial artery, excision of the cyst and closure of the wound and application of the dressings. Electronically signed by: Daryll Brod, MD Date: 03/21/2020 Time: 2:18 PM

## 2020-03-21 NOTE — Discharge Instructions (Addendum)
Next dose of Tylenol can be given after 5:15PM. Next dose of NSAID (Ibuprofen, Motrin, Aleve) can be given after 9:00PM. You had 5mg  of Oxycodone at 3:30PM.     Post Anesthesia Home Care Instructions  Activity: Get plenty of rest for the remainder of the day. A responsible individual must stay with you for 24 hours following the procedure.  For the next 24 hours, DO NOT: -Drive a car -Paediatric nurse -Drink alcoholic beverages -Take any medication unless instructed by your physician -Make any legal decisions or sign important papers.  Meals: Start with liquid foods such as gelatin or soup. Progress to regular foods as tolerated. Avoid greasy, spicy, heavy foods. If nausea and/or vomiting occur, drink only clear liquids until the nausea and/or vomiting subsides. Call your physician if vomiting continues.  Special Instructions/Symptoms: Your throat may feel dry or sore from the anesthesia or the breathing tube placed in your throat during surgery. If this causes discomfort, gargle with warm salt water. The discomfort should disappear within 24 hours.  If you had a scopolamine patch placed behind your ear for the management of post- operative nausea and/or vomiting:  1. The medication in the patch is effective for 72 hours, after which it should be removed.  Wrap patch in a tissue and discard in the trash. Wash hands thoroughly with soap and water. 2. You may remove the patch earlier than 72 hours if you experience unpleasant side effects which may include dry mouth, dizziness or visual disturbances. 3. Avoid touching the patch. Wash your hands with soap and water after contact with the patch.          [facility] Instructions Hand Surgery  Wound Care: Keep your hand elevated above the level of your heart.  Do not allow it to dangle by your side.  Keep the dressing dry and do not remove it unless your doctor advises you to do so.  He will usually change it at the time of your  post-op visit.  Moving your fingers is advised to stimulate circulation but will depend on the site of your surgery.  If you have a splint applied, your doctor will advise you regarding movement.  Activity: Do not drive or operate machinery today.  Rest today and then you may return to your normal activity and work as indicated by your physician.  Diet:  Drink liquids today or eat a light diet.  You may resume a regular diet tomorrow.    General expectations: Pain for two to three days. Fingers may become slightly swollen.  Call your doctor if any of the following occur: Severe pain not relieved by pain medication. Elevated temperature. Dressing soaked with blood. Inability to move fingers. White or bluish color to fingers.

## 2020-03-21 NOTE — H&P (Signed)
Angela Ortiz is an 70 y.o. female.   Chief Complaint: left wrist mass HPI: 70 yo female with mass on left volar wrist.  Previous ganglion excision 30 years ago.  It has since recurred.  Painful at times.  She wishes to have excision of mass.  Ultrasound consistent with ganglion of wrist.  Allergies:  Allergies  Allergen Reactions  . Chlorhexidine Rash    Past Medical History:  Diagnosis Date  . Allergy    seasonal  . Anxiety   . Bipolar disorder ([place])   . 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 Chronic headache   . Depression   . Elevated cholesterol   . Fibromyalgia   . Hypertension     Past Surgical History:  Procedure Laterality Date  . ABDOMINAL HYSTERECTOMY    . AUGMENTATION MAMMAPLASTY Bilateral    removed april 2019  . BREAST ENHANCEMENT SURGERY    . BREAST IMPLANT REMOVAL Bilateral 05/09/2017   Procedure: BILATERAL  REMOVAL BREAST IMPLANTS;  Surgeon: Irene Limbo, MD;  Location: [place];  Service: Plastics;  Laterality: Bilateral;  . CAPSULECTOMY Left 05/09/2017   Procedure: CAPSULECTOMY AND REMOVAL OF IMPLANT MATERIAL ON LEFT;  Surgeon: Irene Limbo, MD;  Location: [place];  Service: Plastics;  Laterality: Left;  . ESOPHAGOGASTRODUODENOSCOPY     over 30 years ago  . explants Bilateral   . right arm surgery    . ULNA OSTEOTOMY  1992   partical    Family History: Family History  Problem Relation Age of Onset  . Diabetes Father   . Heart disease Father   . Cancer Mother   . Osteoarthritis Mother   . Breast cancer Neg Hx   . Colon cancer Neg Hx   . Esophageal cancer Neg Hx   . Colon polyps Neg Hx   . Rectal cancer Neg Hx   . Stomach cancer Neg Hx     Social History:   reports that she has never smoked. She has never used smokeless tobacco. She reports previous alcohol use of about 7.0 standard drinks of alcohol per week. She reports that she does not use drugs.  Medications: Medications Prior to  Admission  Medication Sig Dispense Refill  . Acetaminophen (TYLENOL PO) Take 1,000 mg by mouth 3 (three) times daily. Takes 2 200mg     . amLODipine (NORVASC) 5 MG tablet Take 1 tablet (5 mg total) by mouth daily. 90 tablet 0  . atorvastatin (LIPITOR) 10 MG tablet Take 10 mg by mouth daily.    . citalopram (CELEXA) 10 MG tablet Take 20 mg by mouth daily.    . famotidine (PEPCID) 20 MG tablet Take 20 mg by mouth 2 (two) times daily.     Marland Kitchen gabapentin (NEURONTIN) 600 MG tablet Take 600 mg by mouth at bedtime.    . hydrOXYzine (ATARAX/VISTARIL) 25 MG tablet Take 25 mg by mouth 2 (two) times daily.     Marland Kitchen levothyroxine (SYNTHROID) 25 MCG tablet Take 25 mcg by mouth daily before breakfast.    . LORazepam (ATIVAN) 1 MG tablet Take 1 mg by mouth 3 (three) times daily.    Marland Kitchen omeprazole (PRILOSEC) 20 MG capsule Take 1 capsule (20 mg total) by mouth daily before breakfast. Take 30 minutes before breakfast 30 capsule 6  . polyethylene glycol (MIRALAX / GLYCOLAX) 17 g packet Take 17 g by mouth as needed.    . valsartan (DIOVAN) 160 MG tablet Take 160 mg by  mouth daily.      No results found for this or any previous visit (from the past 48 hour(s)).  No results found.   A comprehensive review of systems was negative.  Blood pressure (!) 130/57, pulse 74, temperature 97.7 F (36.5 C), temperature source Oral, resp. rate 16, height 5\' 6"  (1.676 m), weight 70 kg.  General appearance: alert, cooperative and appears stated age Head: Normocephalic, without obvious abnormality, atraumatic Neck: supple, symmetrical, trachea midline Cardio: regular rate and rhythm Resp: clear to auscultation bilaterally Extremities: Intact sensation and capillary refill all digits.  +epl/fpl/io.  No wounds.  Pulses: 2+ and symmetric Skin: Skin color, texture, turgor normal. No rashes or lesions Neurologic: Grossly normal Incision/Wound: none  Assessment/Plan Left wrist recurrent volar ganglion cyst.  Non operative and  operative treatment options have been discussed with the patient and patient wishes to proceed with operative treatment. Risks, benefits, and alternatives of surgery have been discussed and the patient agrees with the plan of care.   [name] 03/21/2020, 12:55 PM

## 2020-03-22 ENCOUNTER — Encounter (HOSPITAL_BASED_OUTPATIENT_CLINIC_OR_DEPARTMENT_OTHER): Payer: Self-pay | Admitting: Orthopedic Surgery

## 2020-03-22 LAB — SURGICAL PATHOLOGY

## 2020-03-28 DIAGNOSIS — M67432 Ganglion, left wrist: Secondary | ICD-10-CM | POA: Insufficient documentation

## 2020-04-04 ENCOUNTER — Other Ambulatory Visit (HOSPITAL_COMMUNITY)
Admission: RE | Admit: 2020-04-04 | Discharge: 2020-04-04 | Disposition: A | Discharge status: Home / Self Care | Payer: Medicare Other | Source: Ambulatory Visit | Attending: Orthopedic Surgery | Admitting: Orthopedic Surgery

## 2020-04-04 DIAGNOSIS — Z20822 Contact with and (suspected) exposure to covid-19: Secondary | ICD-10-CM | POA: Diagnosis not present

## 2020-04-04 DIAGNOSIS — Z01812 Encounter for preprocedural laboratory examination: Secondary | ICD-10-CM | POA: Insufficient documentation

## 2020-04-04 LAB — SARS CORONAVIRUS 2 (TAT 6-24 HRS): SARS Coronavirus 2: NEGATIVE

## 2020-04-05 ENCOUNTER — Encounter (HOSPITAL_BASED_OUTPATIENT_CLINIC_OR_DEPARTMENT_OTHER): Payer: Self-pay | Admitting: Orthopedic Surgery

## 2020-04-05 ENCOUNTER — Other Ambulatory Visit: Payer: Self-pay | Admitting: Orthopedic Surgery

## 2020-04-05 ENCOUNTER — Other Ambulatory Visit: Payer: Self-pay

## 2020-04-05 NOTE — Anesthesia Preprocedure Evaluation (Addendum)
Anesthesia Evaluation  Patient identified by MRN, date of birth, ID band Patient awake    Reviewed: Allergy & Precautions, NPO status , Patient's Chart, lab work & pertinent test results  History of Anesthesia Complications Negative for: history of anesthetic complications  Airway Mallampati: II  TM Distance: >3 FB Neck ROM: Full    Dental  (+) Missing,    Pulmonary neg pulmonary ROS,    Pulmonary exam normal        Cardiovascular hypertension, Pt. on medications Normal cardiovascular exam     Neuro/Psych  Headaches, Anxiety Depression Bipolar Disorder negative psych ROS   GI/Hepatic Neg liver ROS, GERD  Medicated and Controlled,  Endo/Other  negative endocrine ROS  Renal/GU negative Renal ROS  negative genitourinary   Musculoskeletal  (+) Arthritis , Fibromyalgia -, narcotic dependent  Abdominal   Peds  Hematology negative hematology ROS (+)   Anesthesia Other Findings Day of surgery medications reviewed with patient.  Reproductive/Obstetrics negative OB ROS                            Anesthesia Physical Anesthesia Plan  ASA: II  Anesthesia Plan: General   Post-op Pain Management:    Induction: Intravenous  PONV Risk Score and Plan: 3 and Treatment may vary due to age or medical condition, Ondansetron, Midazolam and Dexamethasone  Airway Management Planned: LMA  Additional Equipment: None  Intra-op Plan:   Post-operative Plan: Extubation in OR  Informed Consent: I have reviewed the patients History and Physical, chart, labs and discussed the procedure including the risks, benefits and alternatives for the proposed anesthesia with the patient or authorized representative who has indicated his/her understanding and acceptance.     Dental advisory given  Plan Discussed with: CRNA  Anesthesia Plan Comments:        Anesthesia Quick Evaluation

## 2020-04-06 ENCOUNTER — Encounter (HOSPITAL_BASED_OUTPATIENT_CLINIC_OR_DEPARTMENT_OTHER)
Admission: RE | Disposition: A | Discharge status: Home / Self Care | Payer: Self-pay | Source: Home / Self Care | Attending: Orthopedic Surgery

## 2020-04-06 ENCOUNTER — Other Ambulatory Visit: Payer: Self-pay

## 2020-04-06 ENCOUNTER — Ambulatory Visit (HOSPITAL_BASED_OUTPATIENT_CLINIC_OR_DEPARTMENT_OTHER)
Admission: RE | Admit: 2020-04-06 | Discharge: 2020-04-06 | Disposition: A | Discharge status: Home / Self Care | Payer: Medicare Other | Attending: Orthopedic Surgery | Admitting: Orthopedic Surgery

## 2020-04-06 ENCOUNTER — Ambulatory Visit (HOSPITAL_BASED_OUTPATIENT_CLINIC_OR_DEPARTMENT_OTHER): Payer: Medicare Other | Admitting: Anesthesiology

## 2020-04-06 ENCOUNTER — Encounter (HOSPITAL_BASED_OUTPATIENT_CLINIC_OR_DEPARTMENT_OTHER): Payer: Self-pay | Admitting: Orthopedic Surgery

## 2020-04-06 DIAGNOSIS — Z79899 Other long term (current) drug therapy: Secondary | ICD-10-CM | POA: Diagnosis not present

## 2020-04-06 DIAGNOSIS — Y838 Other surgical procedures as the cause of abnormal reaction of the patient, or of later complication, without mention of misadventure at the time of the procedure: Secondary | ICD-10-CM | POA: Diagnosis not present

## 2020-04-06 DIAGNOSIS — Z833 Family history of diabetes mellitus: Secondary | ICD-10-CM | POA: Insufficient documentation

## 2020-04-06 DIAGNOSIS — Z8261 Family history of arthritis: Secondary | ICD-10-CM | POA: Insufficient documentation

## 2020-04-06 DIAGNOSIS — M96841 Postprocedural hematoma of a musculoskeletal structure following other procedure: Secondary | ICD-10-CM | POA: Diagnosis present

## 2020-04-06 DIAGNOSIS — Z8249 Family history of ischemic heart disease and other diseases of the circulatory system: Secondary | ICD-10-CM | POA: Diagnosis not present

## 2020-04-06 DIAGNOSIS — S65112A Laceration of radial artery at wrist and hand level of left arm, initial encounter: Secondary | ICD-10-CM | POA: Insufficient documentation

## 2020-04-06 DIAGNOSIS — Z809 Family history of malignant neoplasm, unspecified: Secondary | ICD-10-CM | POA: Diagnosis not present

## 2020-04-06 DIAGNOSIS — Z8589 Personal history of malignant neoplasm of other organs and systems: Secondary | ICD-10-CM | POA: Insufficient documentation

## 2020-04-06 DIAGNOSIS — Z79890 Hormone replacement therapy (postmenopausal): Secondary | ICD-10-CM | POA: Diagnosis not present

## 2020-04-06 HISTORY — PX: HEMATOMA EVACUATION: SHX5118

## 2020-04-06 HISTORY — PX: NERVE, TENDON AND ARTERY REPAIR: SHX5695

## 2020-04-06 SURGERY — EVACUATION HEMATOMA
Anesthesia: General | Site: Wrist | Laterality: Left

## 2020-04-06 MED ORDER — ACETAMINOPHEN 325 MG PO TABS
325.0000 mg | ORAL_TABLET | Freq: Once | ORAL | Status: AC
  Administered 2020-04-06: 325 mg via ORAL

## 2020-04-06 MED ORDER — DEXAMETHASONE SODIUM PHOSPHATE 10 MG/ML IJ SOLN
INTRAMUSCULAR | Status: AC
  Filled 2020-04-06: qty 1

## 2020-04-06 MED ORDER — ACETAMINOPHEN 325 MG PO TABS
ORAL_TABLET | ORAL | Status: AC
  Filled 2020-04-06: qty 1

## 2020-04-06 MED ORDER — OXYCODONE HCL 5 MG PO TABS
ORAL_TABLET | ORAL | Status: AC
  Filled 2020-04-06: qty 1

## 2020-04-06 MED ORDER — LACTATED RINGERS IV SOLN: INTRAVENOUS | Status: DC

## 2020-04-06 MED ORDER — PHENYLEPHRINE HCL (PRESSORS) 10 MG/ML IV SOLN
INTRAVENOUS | Status: DC | PRN
  Administered 2020-04-06: 40 ug via INTRAVENOUS

## 2020-04-06 MED ORDER — FENTANYL CITRATE (PF) 100 MCG/2ML IJ SOLN
INTRAMUSCULAR | Status: AC
  Filled 2020-04-06: qty 2

## 2020-04-06 MED ORDER — ONDANSETRON HCL 4 MG/2ML IJ SOLN
INTRAMUSCULAR | Status: DC | PRN
  Administered 2020-04-06: 4 mg via INTRAVENOUS

## 2020-04-06 MED ORDER — OXYCODONE HCL 5 MG/5ML PO SOLN: 5.0000 mg | Freq: Once | ORAL | Status: AC | PRN

## 2020-04-06 MED ORDER — LIDOCAINE HCL (CARDIAC) PF 100 MG/5ML IV SOSY
PREFILLED_SYRINGE | INTRAVENOUS | Status: DC | PRN
  Administered 2020-04-06: 80 mg via INTRAVENOUS

## 2020-04-06 MED ORDER — CEFAZOLIN SODIUM-DEXTROSE 2-3 GM-%(50ML) IV SOLR
INTRAVENOUS | Status: DC | PRN
  Administered 2020-04-06: 2 g via INTRAVENOUS

## 2020-04-06 MED ORDER — HYDROCODONE-ACETAMINOPHEN 5-325 MG PO TABS: ORAL_TABLET | ORAL | 0 refills | Status: AC

## 2020-04-06 MED ORDER — LACTATED RINGERS IV SOLN: INTRAVENOUS | Status: DC | PRN

## 2020-04-06 MED ORDER — FENTANYL CITRATE (PF) 100 MCG/2ML IJ SOLN
INTRAMUSCULAR | Status: DC | PRN
  Administered 2020-04-06: 50 ug via INTRAVENOUS

## 2020-04-06 MED ORDER — PROPOFOL 10 MG/ML IV BOLUS
INTRAVENOUS | Status: AC
  Filled 2020-04-06: qty 20

## 2020-04-06 MED ORDER — OXYCODONE HCL 5 MG PO TABS
5.0000 mg | ORAL_TABLET | Freq: Once | ORAL | Status: AC | PRN
  Administered 2020-04-06: 5 mg via ORAL

## 2020-04-06 MED ORDER — CEFAZOLIN SODIUM-DEXTROSE 2-4 GM/100ML-% IV SOLN: 2.0000 g | INTRAVENOUS | Status: DC

## 2020-04-06 MED ORDER — HYDROCODONE-ACETAMINOPHEN 5-325 MG PO TABS
ORAL_TABLET | ORAL | Status: AC
  Filled 2020-04-06: qty 1

## 2020-04-06 MED ORDER — ONDANSETRON HCL 4 MG/2ML IJ SOLN
INTRAMUSCULAR | Status: AC
  Filled 2020-04-06: qty 2

## 2020-04-06 MED ORDER — ACETAMINOPHEN 500 MG PO TABS: 1000.0000 mg | ORAL_TABLET | Freq: Once | ORAL | Status: DC

## 2020-04-06 MED ORDER — HYDROCODONE-ACETAMINOPHEN 5-325 MG PO TABS
1.0000 | ORAL_TABLET | Freq: Once | ORAL | Status: AC
  Administered 2020-04-06: 1 via ORAL

## 2020-04-06 MED ORDER — DEXAMETHASONE SODIUM PHOSPHATE 10 MG/ML IJ SOLN
INTRAMUSCULAR | Status: DC | PRN
  Administered 2020-04-06: 5 mg via INTRAVENOUS

## 2020-04-06 MED ORDER — PROMETHAZINE HCL 25 MG/ML IJ SOLN
6.2500 mg | INTRAMUSCULAR | Status: DC | PRN
Start: 2020-04-06 — End: 2020-04-06

## 2020-04-06 MED ORDER — BUPIVACAINE HCL (PF) 0.25 % IJ SOLN
INTRAMUSCULAR | Status: DC | PRN
  Administered 2020-04-06: 8 mL

## 2020-04-06 MED ORDER — PROPOFOL 10 MG/ML IV BOLUS
INTRAVENOUS | Status: AC
  Filled 2020-04-06: qty 40

## 2020-04-06 MED ORDER — FENTANYL CITRATE (PF) 100 MCG/2ML IJ SOLN
25.0000 ug | INTRAMUSCULAR | Status: DC | PRN
Start: 2020-04-06 — End: 2020-04-06
  Administered 2020-04-06: 25 ug via INTRAVENOUS

## 2020-04-06 MED ORDER — PROPOFOL 10 MG/ML IV BOLUS
INTRAVENOUS | Status: DC | PRN
  Administered 2020-04-06: 150 mg via INTRAVENOUS

## 2020-04-06 MED ORDER — THROMBIN 5000 UNITS EX SOLR
CUTANEOUS | Status: DC | PRN
  Administered 2020-04-06: 5000 [IU] via TOPICAL

## 2020-04-06 SURGICAL SUPPLY — 61 items
BAG DECANTER FOR FLEXI CONT (MISCELLANEOUS) IMPLANT
BLADE MINI RND TIP GREEN BEAV (BLADE) IMPLANT
BLADE SURG 15 STRL LF DISP TIS (BLADE) ×2 IMPLANT
BLADE SURG 15 STRL SS (BLADE) ×4
BNDG ELASTIC 3X5.8 VLCR STR LF (GAUZE/BANDAGES/DRESSINGS) ×2 IMPLANT
BNDG ESMARK 4X9 LF (GAUZE/BANDAGES/DRESSINGS) IMPLANT
BNDG GAUZE ELAST 4 BULKY (GAUZE/BANDAGES/DRESSINGS) ×2 IMPLANT
BRUSH SCRUB EZ PLAIN DRY (MISCELLANEOUS) ×2 IMPLANT
CATH ROBINSON RED A/P 10FR (CATHETERS) IMPLANT
CHLORAPREP W/TINT 26 (MISCELLANEOUS) IMPLANT
CORD BIPOLAR FORCEPS 12FT (ELECTRODE) ×2 IMPLANT
COVER BACK TABLE 60X90IN (DRAPES) ×2 IMPLANT
COVER MAYO STAND STRL (DRAPES) ×2 IMPLANT
COVER WAND RF STERILE (DRAPES) IMPLANT
CUFF TOURN SGL QUICK 18X4 (TOURNIQUET CUFF) ×2 IMPLANT
DECANTER SPIKE VIAL GLASS SM (MISCELLANEOUS) ×2 IMPLANT
DRAPE EXTREMITY T 121X128X90 (DISPOSABLE) ×2 IMPLANT
DRAPE SURG 17X23 STRL (DRAPES) ×2 IMPLANT
GAUZE SPONGE 4X4 12PLY STRL (GAUZE/BANDAGES/DRESSINGS) ×2 IMPLANT
GAUZE XEROFORM 1X8 LF (GAUZE/BANDAGES/DRESSINGS) ×2 IMPLANT
GLOVE SRG 8 PF TXTR STRL LF DI (GLOVE) ×1 IMPLANT
GLOVE SURG ENC MOIS LTX SZ7.5 (GLOVE) ×2 IMPLANT
GLOVE SURG ORTHO LTX SZ8 (GLOVE) ×2 IMPLANT
GLOVE SURG UNDER POLY LF SZ8 (GLOVE) ×2
GLOVE SURG UNDER POLY LF SZ8.5 (GLOVE) ×2 IMPLANT
GOWN STRL REUS W/ TWL LRG LVL3 (GOWN DISPOSABLE) ×3 IMPLANT
GOWN STRL REUS W/TWL LRG LVL3 (GOWN DISPOSABLE) ×6
GOWN STRL REUS W/TWL XL LVL3 (GOWN DISPOSABLE) ×4 IMPLANT
LOOP VESSEL MAXI BLUE (MISCELLANEOUS) IMPLANT
NDL SAFETY ECLIPSE 18X1.5 (NEEDLE) IMPLANT
NEEDLE HYPO 18GX1.5 SHARP (NEEDLE)
NEEDLE HYPO 25X1 1.5 SAFETY (NEEDLE) IMPLANT
NS IRRIG 1000ML POUR BTL (IV SOLUTION) ×2 IMPLANT
PACK BASIN DAY SURGERY FS (CUSTOM PROCEDURE TRAY) ×2 IMPLANT
PAD CAST 3X4 CTTN HI CHSV (CAST SUPPLIES) ×1 IMPLANT
PAD CAST 4YDX4 CTTN HI CHSV (CAST SUPPLIES) IMPLANT
PADDING CAST ABS 4INX4YD NS (CAST SUPPLIES) ×1
PADDING CAST ABS COTTON 4X4 ST (CAST SUPPLIES) ×1 IMPLANT
PADDING CAST COTTON 3X4 STRL (CAST SUPPLIES) ×2
PADDING CAST COTTON 4X4 STRL (CAST SUPPLIES)
SLEEVE SCD COMPRESS KNEE MED (STOCKING) IMPLANT
SPEAR EYE SURG WECK-CEL (MISCELLANEOUS) ×2 IMPLANT
SPLINT PLASTER CAST XFAST 3X15 (CAST SUPPLIES) IMPLANT
SPLINT PLASTER XTRA FASTSET 3X (CAST SUPPLIES)
SPONGE SURGIFOAM ABS GEL 12-7 (HEMOSTASIS) ×2 IMPLANT
STOCKINETTE 4X48 STRL (DRAPES) ×2 IMPLANT
SUT ETHIBOND 3-0 V-5 (SUTURE) IMPLANT
SUT ETHILON 4 0 PS 2 18 (SUTURE) ×2 IMPLANT
SUT FIBERWIRE 4-0 18 TAPR NDL (SUTURE)
SUT NYLON 9 0 VRM6 (SUTURE) IMPLANT
SUT PROLENE 6 0 BV (SUTURE) ×2 IMPLANT
SUT PROLENE 6 0 P 1 18 (SUTURE) IMPLANT
SUT SILK 4 0 PS 2 (SUTURE) IMPLANT
SUT SUPRAMID 4-0 (SUTURE) IMPLANT
SUT VICRYL 4-0 PS2 18IN ABS (SUTURE) IMPLANT
SUTURE FIBERWR 4-0 18 TAPR NDL (SUTURE) IMPLANT
SYR BULB EAR ULCER 3OZ GRN STR (SYRINGE) ×2 IMPLANT
SYR CONTROL 10ML LL (SYRINGE) IMPLANT
TOWEL GREEN STERILE FF (TOWEL DISPOSABLE) ×4 IMPLANT
TRAY DSU PREP LF (CUSTOM PROCEDURE TRAY) ×2 IMPLANT
UNDERPAD 30X36 HEAVY ABSORB (UNDERPADS AND DIAPERS) ×2 IMPLANT

## 2020-04-06 NOTE — Op Note (Signed)
NAME: Angela Ortiz MEDICAL RECORD NO: [id number] DATE OF BIRTH: 1950/11/14 FACILITY: [facility] LOCATION: [facility] PHYSICIAN: Tennis Must, MD   OPERATIVE REPORT   DATE OF PROCEDURE: 04/06/20    PREOPERATIVE DIAGNOSIS:   Left wrist postop hematoma   POSTOPERATIVE DIAGNOSIS:   Left wrist postop hematoma and radial artery tear   PROCEDURE:   1.  Evacuation hematoma left wrist 2.  Repair radial artery laceration   SURGEON:  [name], M.D.   ASSISTANT: Daryll Brod, MD   ANESTHESIA:  General   INTRAVENOUS FLUIDS:  Per anesthesia flow sheet.   ESTIMATED BLOOD LOSS:  Minimal.   COMPLICATIONS:  None.   SPECIMENS:  none   TOURNIQUET TIME:    Total Tourniquet Time Documented: Upper Arm (Left) - 7 minutes Upper Arm (Left) - 20 minutes Total: Upper Arm (Left) - 27 minutes    DISPOSITION:  Stable to PACU.   INDICATIONS: 70 year old female 2 weeks status post excision of recurrent left wrist volar ganglion cyst.  She has had pain in the wrist and developed a swelling at the surgical site.  I recommended operative exploration with evacuation of hematoma and repair versus resection versus ligation of artery as necessary. Risks, benefits and alternatives of surgery were discussed including the risks of blood loss, infection, damage to nerves, vessels, tendons, ligaments, bone for surgery, need for additional surgery, complications with wound healing, continued pain, stiffness.  She voiced understanding of these risks and elected to proceed.  OPERATIVE COURSE:  After being identified preoperatively by myself,  the patient and I agreed on the procedure and site of the procedure.  The surgical site was marked.  Surgical consent had been signed. She was given IV antibiotics as preoperative antibiotic prophylaxis. She was transferred to the operating room and placed on the operating table in supine position with the Left upper extremity on an arm board.  General  anesthesia was induced by the anesthesiologist.  Left upper extremity was prepped and draped in normal sterile orthopedic fashion.  A surgical pause was performed between the surgeons, anesthesia, and operating room staff and all were in agreement as to the patient, procedure, and site of procedure.  Tourniquet at the proximal aspect of the extremity was inflated to 250 mmHg after exsanguination of the arm with an Esmarch bandage.    The sutures were removed and the wound opened.  There was hematoma within the wound.  Approximately 10 cc of hematoma was evacuated.  The wound was extended both proximally and distally to aid in visualization.  It was copiously irrigated with sterile saline.  It was explored.  The tourniquet was deflated at 7 minutes to allow better visualization.  A bleeding vein was localized distally.  The tourniquet was reinflated.  The bleeding vessel was carefully freed up. The radial artery was identified.  It was traced from proximal to distal.  There was a tear toward the distal aspect.  This was adjacent to the suture placed at the previous surgery for a bleeding site from the artery.  The tear was just proximal to this previous suture.  The tourniquet was deflated at 20 minutes.    The bleeding vein distally was treated with bipolar electrocautery to obtain hemostasis.  The bipolar electrocautery was used to obtain hemostasis throughout the wound.  There was pulsatile bleeding from a tear in the artery.  The artery was compressed proximally and distally to aid in visualization.  The tear in the artery was repaired  with a 6-0 Prolene suture in figure-of-eight fashion.  Good repair was obtained.  Compression of the artery was released and there was no pulsatile bleeding from the tear.  Direct pressure was held on the wound.  The wound was hemostatic with only slow venous bleeding.  Gelfoam soaked in thrombin was placed in the wound and the tissue edges treated with thrombin.  The wound was  closed with 4-0 nylon in a horizontal mattress fashion.  It was injected with quarter percent plain Marcaine to aid in postoperative analgesia.  All fingertips were pink with brisk capillary refill.   Wound was dressed with sterile Xeroform 4 x 4's and wrapped with a Kerlix bandage.  Volar splint was placed and wrapped with Kerlix and Ace bandage.  The operative  drapes were broken down.  The patient was awoken from anesthesia safely.  She was transferred back to the stretcher and taken to PACU in stable condition.  I will see her back in the office in 1 week for postoperative followup.  I will give her a prescription for Norco 5/325 1-2 tabs PO q6 hours prn pain, dispense # 25.   [name], MD Electronically signed, 04/06/20

## 2020-04-06 NOTE — Op Note (Signed)
I assisted Surgeon(s) and Role:    * [name], MD - Primary    Daryll Brod, MD - Assisting on the Procedure(s): EVACUATION HEMATOMA LEFT WRIST ARTERY REPAIR LEFT WRIST on 04/06/2020.  I provided assistance on this case as follows: setup, approach, evacuation of the hematoma, exploration of the artery, repair of radial artery tear, closure of the wound and application of the dressing and splint. Electronically signed by: Daryll Brod, MD Date: 04/06/2020 Time: 4:35 PM

## 2020-04-06 NOTE — Anesthesia Procedure Notes (Signed)
Procedure Name: LMA Insertion Performed by: Konstantina Nachreiner M, CRNA Pre-anesthesia Checklist: Patient identified, Emergency Drugs available, Suction available and Patient being monitored Patient Re-evaluated:Patient Re-evaluated prior to induction Oxygen Delivery Method: Circle system utilized Preoxygenation: Pre-oxygenation with 100% oxygen Induction Type: IV induction Ventilation: Mask ventilation without difficulty LMA: LMA inserted LMA Size: 4.0 Number of attempts: 1 Airway Equipment and Method: Bite block Placement Confirmation: positive ETCO2 Tube secured with: Tape Dental Injury: Teeth and Oropharynx as per pre-operative assessment        

## 2020-04-06 NOTE — Discharge Instructions (Addendum)

## 2020-04-06 NOTE — Transfer of Care (Signed)
Immediate Anesthesia Transfer of Care Note  Patient: Angela Ortiz  Procedure(s) Performed: EVACUATION HEMATOMA LEFT WRIST (Left Wrist) ARTERY REPAIR LEFT WRIST (Left Wrist)  Patient Location: PACU  Anesthesia Type:General  Level of Consciousness: awake, alert  and oriented  Airway & Oxygen Therapy: Patient Spontanous Breathing and Patient connected to face mask oxygen  Post-op Assessment: Report given to RN and Post -op Vital signs reviewed and stable  Post vital signs: Reviewed and stable  Last Vitals:  Vitals Value Taken Time  BP 139/78 04/06/20 1640  Temp    Pulse 92 04/06/20 1642  Resp 16 04/06/20 1642  SpO2 92 % 04/06/20 1642  Vitals shown include unvalidated device data.  Last Pain:  Vitals:   04/06/20 1254  TempSrc: Oral  PainSc: 4       Patients Stated Pain Goal: 5 (28/11/88 6773)  Complications: No complications documented.

## 2020-04-06 NOTE — H&P (Signed)
Angela Ortiz is an 70 y.o. female.   Chief Complaint: left wrist hematoma HPI: 70 yo female 2 weeks s/p left wrist excision recurrent ganglion cyst.  Has had swelling and ecchymosis at surgical site and continued pain.  Allergies: No Known Allergies  Past Medical History:  Diagnosis Date  . Allergy    seasonal  . Anxiety   . Bipolar disorder ([place])   . 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 Chronic headache   . Depression   . Elevated cholesterol   . Fibromyalgia   . Hypertension     Past Surgical History:  Procedure Laterality Date  . ABDOMINAL HYSTERECTOMY    . AUGMENTATION MAMMAPLASTY Bilateral    removed april 2019  . BREAST ENHANCEMENT SURGERY    . BREAST IMPLANT REMOVAL Bilateral 05/09/2017   Procedure: BILATERAL  REMOVAL BREAST IMPLANTS;  Surgeon: Irene Limbo, MD;  Location: [place];  Service: Plastics;  Laterality: Bilateral;  . CAPSULECTOMY Left 05/09/2017   Procedure: CAPSULECTOMY AND REMOVAL OF IMPLANT MATERIAL ON LEFT;  Surgeon: Irene Limbo, MD;  Location: [place];  Service: Plastics;  Laterality: Left;  . ESOPHAGOGASTRODUODENOSCOPY     over 30 years ago  . explants Bilateral   . GANGLION CYST EXCISION Left 03/21/2020   Procedure: EXCISION RECURRENT GANGLION LEFT WRIST;  Surgeon: [name], MD;  Location: [place];  Service: Orthopedics;  Laterality: Left;  45 MINUTES  . right arm surgery    . ULNA OSTEOTOMY  1992   partical    Family History: Family History  Problem Relation Age of Onset  . Diabetes Father   . Heart disease Father   . Cancer Mother   . Osteoarthritis Mother   . Breast cancer Neg Hx   . Colon cancer Neg Hx   . Esophageal cancer Neg Hx   . Colon polyps Neg Hx   . Rectal cancer Neg Hx   . Stomach cancer Neg Hx     Social History:   reports that she has never smoked. She has never used smokeless tobacco. She reports previous alcohol use of about 7.0 standard  drinks of alcohol per week. She reports that she does not use drugs.  Medications: Medications Prior to Admission  Medication Sig Dispense Refill  . amLODipine (NORVASC) 5 MG tablet Take 1 tablet (5 mg total) by mouth daily. 90 tablet 0  . atorvastatin (LIPITOR) 10 MG tablet Take 10 mg by mouth daily.    . citalopram (CELEXA) 10 MG tablet Take 20 mg by mouth daily.    . famotidine (PEPCID) 20 MG tablet Take 20 mg by mouth 2 (two) times daily.     Marland Kitchen gabapentin (NEURONTIN) 600 MG tablet Take 600 mg by mouth at bedtime.    Marland Kitchen HYDROcodone-acetaminophen (NORCO) 5-325 MG tablet 1-2 tabs po q6 hours prn pain 20 tablet 0  . hydrOXYzine (ATARAX/VISTARIL) 25 MG tablet Take 25 mg by mouth 2 (two) times daily.     Marland Kitchen levothyroxine (SYNTHROID) 25 MCG tablet Take 25 mcg by mouth daily before breakfast.    . LORazepam (ATIVAN) 1 MG tablet Take 1 mg by mouth 3 (three) times daily.    Marland Kitchen omeprazole (PRILOSEC) 20 MG capsule Take 1 capsule (20 mg total) by mouth daily before breakfast. Take 30 minutes before breakfast 30 capsule 6  . polyethylene glycol (MIRALAX / GLYCOLAX) 17 g packet Take 17 g by mouth as needed.    Marland Kitchen  valsartan (DIOVAN) 160 MG tablet Take 160 mg by mouth daily.      Results for orders placed or performed during the hospital encounter of 04/04/20 (from the past 48 hour(s))  SARS CORONAVIRUS 2 (TAT 6-24 HRS) Nasopharyngeal Nasopharyngeal Swab     Status: None   Collection Time: 04/04/20  2:26 PM   Specimen: Nasopharyngeal Swab  Result Value Ref Range   SARS Coronavirus 2 NEGATIVE NEGATIVE    Comment: (NOTE) SARS-CoV-2 target nucleic acids are NOT DETECTED.  The SARS-CoV-2 RNA is generally detectable in upper and lower respiratory specimens during the acute phase of infection. Negative results do not preclude SARS-CoV-2 infection, do not rule out co-infections with other pathogens, and should not be used as the sole basis for treatment or other patient management decisions. Negative  results must be combined with clinical observations, patient history, and epidemiological information. The expected result is Negative.  Fact Sheet for Patients: SugarRoll.be  Fact Sheet for Healthcare Providers: https://www.woods-mathews.com/  This test is not yet approved or cleared by the [place] FDA and  has been authorized for detection and/or diagnosis of SARS-CoV-2 by FDA under an Emergency Use Authorization (EUA). This EUA will remain  in effect (meaning this test can be used) for the duration of the COVID-19 declaration under Se ction 564(b)(1) of the Act, 21 U.S.C. section 360bbb-3(b)(1), unless the authorization is terminated or revoked sooner.  Performed at [facility], [place] [address], [place],  [address]     No results found.   A comprehensive review of systems was negative.  Blood pressure (!) 126/53, pulse 65, temperature 97.7 F (36.5 C), temperature source Oral, resp. rate 18, height 5\' 6"  (1.676 m), weight 71.8 kg, SpO2 100 %.  General appearance: alert, cooperative and appears stated age Head: Normocephalic, without obvious abnormality, atraumatic Neck: supple, symmetrical, trachea midline Cardio: regular rate and rhythm Resp: clear to auscultation bilaterally Extremities: Intact sensation and capillary refill all digits.  +epl/fpl/io.  Wound without erythema Pulses: 2+ and symmetric Skin: Skin color, texture, turgor normal. No rashes or lesions Neurologic: Grossly normal Incision/Wound: as above  Assessment/Plan Left wrist hematoma.  Plan evacuation left wrist hematoma with exploration radial artery with repair vs resection vs ligation vs vein graft as necessary.  Non operative and operative treatment options have been discussed with the patient and patient wishes to proceed with operative treatment. Risks, benefits, and alternatives of surgery have been discussed and the patient agrees with the  plan of care.   [name] 04/06/2020, 2:13 PM

## 2020-04-07 ENCOUNTER — Encounter (HOSPITAL_BASED_OUTPATIENT_CLINIC_OR_DEPARTMENT_OTHER): Payer: Self-pay | Admitting: Orthopedic Surgery

## 2020-04-07 NOTE — Anesthesia Postprocedure Evaluation (Signed)
Anesthesia Post Note  Patient: Angela Ortiz  Procedure(s) Performed: EVACUATION HEMATOMA LEFT WRIST (Left Wrist) ARTERY REPAIR LEFT WRIST (Left Wrist)     Patient location during evaluation: PACU Anesthesia Type: General Level of consciousness: awake and alert and oriented Pain management: pain level controlled Vital Signs Assessment: post-procedure vital signs reviewed and stable Respiratory status: spontaneous breathing, nonlabored ventilation and respiratory function stable Cardiovascular status: blood pressure returned to baseline Postop Assessment: no apparent nausea or vomiting Anesthetic complications: no   No complications documented.  Last Vitals:  Vitals:   04/06/20 1715 04/06/20 1745  BP: 134/61 (!) 140/59  Pulse: 81 78  Resp: 11 16  Temp:  36.4 C  SpO2: 100% 100%    Last Pain:  Vitals:   04/06/20 1745  TempSrc:   PainSc: [place]

## 2020-04-09 NOTE — Progress Notes (Signed)
Office Visit Note  Patient: Angela Ortiz             Date of Birth: 25-Apr-1950           MRN: [id number]             PCP: Ronita Hipps, MD Referring: Ronita Hipps, MD Visit Date: 04/10/2020   Subjective:  Follow-up (Patient has noticed improvement in symptoms since last visit. Patient is taking Amlodipine daily, but is no longer using Kenalog cream. )   History of Present Illness: Angela Ortiz is a 70 y.o. female here for follow up of raynaud's phenomenon and skin rashes on amlodipine 5 mg daily and topical steroids as needed. Since the last visit her skin has improved a lot and she stopped using the steroid due to not enough symptoms bothering her. She had a left wrist ganglion cyst excision surgery complicated by pseudoaneurysm requiring another surgery for evacuation and is recovering from this. Overall feels pleased with symptoms improvement at this time.   Review of Systems  Constitutional: Positive for fatigue.  HENT: Positive for mouth sores. Negative for mouth dryness and nose dryness.   Eyes: Positive for itching. Negative for pain, visual disturbance and dryness.  Respiratory: Positive for cough and wheezing. Negative for hemoptysis, shortness of breath and difficulty breathing.   Cardiovascular: Negative for chest pain, palpitations and swelling in legs/feet.  Gastrointestinal: Negative for abdominal pain, blood in stool, constipation and diarrhea.  Endocrine: Negative for increased urination.  Genitourinary: Negative for painful urination.  Musculoskeletal: Positive for arthralgias, joint pain, myalgias, muscle weakness, morning stiffness, muscle tenderness and myalgias. Negative for joint swelling.  Skin: Negative for color change, rash and redness.  Allergic/Immunologic: Negative for susceptible to infections.  Neurological: Positive for dizziness, headaches and weakness. Negative for numbness and memory loss.  Hematological: Negative for swollen  glands.  Psychiatric/Behavioral: Positive for sleep disturbance. Negative for confusion.     Previous HPI: Angela Ortiz is a 70 y.o. female here for follow up of raynaud's phenomenon and skin rashes. After her last visit 1  Month ago we discussed starting a low dose amlodipine for her raynaud's phenomenon. She had persistent erythematous changes of the digits suggestive of chilblains so also recommended use of topical steroids. She called to report feeling poorly after starting the medication and decreaing other antihypertensive by half so recommended she follow up with PCP office. She changed to taking her two antihypertensives at different time of day with more stable blood pressures since this change. She also discontinued amantadine and restless leg syndrome has been tolerable. About 2 weeks ago she experienced purple discoloration once limited to the distal joint of a finger and once limited to the palmar aspect over one PIP joint. These each improved on their own after 2-3 days and were nonpainful and without sensory change. She also describes sensation of flushing of the skin over her face and trunk and a sense as if small particles or air were rubbing against her skin. These last a few minutes at a time and she has not noticed a particular trigger for these.  PMFS History:  Patient Active Problem List   Diagnosis Date Noted   Ganglion cyst of volar aspect of left wrist 03/28/2020   Chilblains 01/11/2020   Headache disorder 11/15/2019   Rash and other nonspecific skin eruption 11/15/2019   Arthralgia 11/15/2019   Osteoarthritis of knees, bilateral 11/15/2019   Raynaud phenomenon 11/15/2019   Shortness of breath  10/05/2019   Abnormal EKG 10/05/2019   Essential hypertension 10/05/2019   Mixed hyperlipidemia 10/05/2019   Nuclear sclerosis of both eyes 05/07/2017   Vitreous floaters of both eyes 05/07/2017   Fibromyalgia 04/17/2017   Unspecified convulsions ([place])  06/14/2014   Bipolar affective disorder, manic, severe ([place]) 06/08/2014   Encounter for preadmission testing    Mania ([place])    Anxiety 05/18/2014   Panic disorder 05/18/2014   Insomnia 05/18/2014    Past Medical History:  Diagnosis Date   Allergy    seasonal   Anxiety    Bipolar disorder ([place])    Cancer ([place]) 1986?   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   Chronic headache    Depression    Elevated cholesterol    Fibromyalgia    Hypertension     Family History  Problem Relation Age of Onset   Diabetes Father    Heart disease Father    Cancer Mother    Osteoarthritis Mother    Breast cancer Neg Hx    Colon cancer Neg Hx    Esophageal cancer Neg Hx    Colon polyps Neg Hx    Rectal cancer Neg Hx    Stomach cancer Neg Hx    Past Surgical History:  Procedure Laterality Date   ABDOMINAL HYSTERECTOMY     AUGMENTATION MAMMAPLASTY Bilateral    removed april 2019   BREAST ENHANCEMENT SURGERY     BREAST IMPLANT REMOVAL Bilateral 05/09/2017   Procedure: BILATERAL  REMOVAL BREAST IMPLANTS;  Surgeon: Irene Limbo, MD;  Location: [facility];  Service: Plastics;  Laterality: Bilateral;   CAPSULECTOMY Left 05/09/2017   Procedure: CAPSULECTOMY AND REMOVAL OF IMPLANT MATERIAL ON LEFT;  Surgeon: Irene Limbo, MD;  Location: [facility];  Service: Plastics;  Laterality: Left;   ESOPHAGOGASTRODUODENOSCOPY     over 30 years ago   explants Bilateral    GANGLION CYST EXCISION Left 03/21/2020   Procedure: EXCISION RECURRENT GANGLION LEFT WRIST;  Surgeon: [name], MD;  Location: [facility];  Service: Orthopedics;  Laterality: Left;  45 MINUTES   HEMATOMA EVACUATION Left 04/06/2020   Procedure: EVACUATION HEMATOMA LEFT WRIST;  Surgeon: [name], MD;  Location: [facility];  Service: Orthopedics;  Laterality: Left;   NERVE, TENDON AND ARTERY REPAIR Left 04/06/2020   Procedure: ARTERY REPAIR LEFT WRIST;   Surgeon: [name], MD;  Location: [facility];  Service: Orthopedics;  Laterality: Left;   right arm surgery     [place]   partical   Social History   Social History Narrative   Not on file   Immunization History  Administered Date(s) Administered   Moderna Sars-Covid-2 Vaccination 03/18/2019, 04/15/2019, 11/29/2019     Objective: Vital Signs: BP 114/73 (BP Location: Right Arm, Patient Position: Sitting, Cuff Size: Normal)    Pulse 63    Ht 5' 6.5" (1.689 m)    Wt 158 lb 12.8 oz (72 kg)    BMI 25.25 kg/m    Physical Exam HENT:     Right Ear: External ear normal.     Left Ear: External ear normal.  Eyes:     Conjunctiva/sclera: Conjunctivae normal.  Skin:    General: Skin is warm and dry.  Neurological:     General: No focal deficit present.     Mental Status: She is alert.  Psychiatric:        Mood and Affect: Mood normal.  Musculoskeletal Exam:  Right wrist full ROM no tenderness or swelling, left arm in sling with ACE wrap forearm, wrist, and hand Fingers full ROM no tenderness or swelling, no nail pitting or finger pad pitting or lesions Ankles full ROM no tenderness or swelling Toes no swelling no erythema no blistering or nail or toe pad pitting or lesions  Investigation: No additional findings.  Imaging: No results found.  Recent Labs: Lab Results  Component Value Date   WBC 7.3 08/17/2019   HGB 12.8 08/17/2019   PLT 323.0 08/17/2019   NA 138 08/17/2019   K 4.0 08/17/2019   CL 99 08/17/2019   CO2 31 08/17/2019   GLUCOSE 102 (H) 08/17/2019   BUN 8 08/17/2019   CREATININE 0.88 08/17/2019   BILITOT 0.5 08/17/2019   ALKPHOS 68 08/17/2019   AST 17 08/17/2019   ALT 13 08/17/2019   PROT 7.0 08/17/2019   ALBUMIN 4.7 08/17/2019   CALCIUM 10.0 08/17/2019   GFRAA >60 06/07/2014    Speciality Comments: No specialty comments available.  Procedures:  No procedures performed Allergies: Patient has no known  allergies.   Assessment / Plan:     Visit Diagnoses: Raynaud's phenomenon without gangrene - Plan: amLODipine (NORVASC) 5 MG tablet  Raynaud's symptoms are well controlled now with no frequent episodes and no finger or toe discoloration or blistering. She is no longer using the triamcinolone topically due to decreased symptoms. I expect it will do at least as well in upcoming months due to warmer weather. She has noticed somewhat more trouble sleeping with stopping amantadine but right now her arm pain postoperatively is a bigger contributor with this. No dizziness, lightheadedness, problematic edema, or hypotension. I recommend continuing current amlodipine 5 mg daily and f/u in 1 yr or as needed.  Rash and other nonspecific skin eruption - Plan: amLODipine (NORVASC) 5 MG tablet  Erythema and blistering over her digits is improved and the generalized redness and itching problem also somewhat improved. Discussed steroid is fine to stop except as needed no new treatment at this time.  Orders: No orders of the defined types were placed in this encounter.  Meds ordered this encounter  Medications   amLODipine (NORVASC) 5 MG tablet    Sig: Take 1 tablet (5 mg total) by mouth daily.    Dispense:  90 tablet    Refill:  3     Follow-Up Instructions: Return in about 1 year (around 04/10/2021) for Raynaud's phenomenon on amlodipine.   Collier Salina, MD  Note - This record has been created using [employer].  Chart creation errors have been sought, but may not always  have been located. Such creation errors do not reflect on  the standard of medical care.

## 2020-04-10 ENCOUNTER — Encounter: Payer: Self-pay | Admitting: Internal Medicine

## 2020-04-10 ENCOUNTER — Ambulatory Visit (INDEPENDENT_AMBULATORY_CARE_PROVIDER_SITE_OTHER): Payer: Medicare Other | Admitting: Internal Medicine

## 2020-04-10 ENCOUNTER — Other Ambulatory Visit: Payer: Self-pay

## 2020-04-10 VITALS — BP 114/73 | HR 63 | Ht 66.5 in | Wt 158.8 lb

## 2020-04-10 DIAGNOSIS — I7300 Raynaud's syndrome without gangrene: Secondary | ICD-10-CM

## 2020-04-10 DIAGNOSIS — R21 Rash and other nonspecific skin eruption: Secondary | ICD-10-CM | POA: Diagnosis not present

## 2020-04-10 MED ORDER — AMLODIPINE BESYLATE 5 MG PO TABS
5.0000 mg | ORAL_TABLET | Freq: Every day | ORAL | 3 refills | Status: DC
Start: 2020-04-10 — End: 2021-04-09

## 2020-06-30 ENCOUNTER — Other Ambulatory Visit: Payer: Self-pay

## 2020-06-30 ENCOUNTER — Telehealth: Payer: Self-pay | Admitting: Gastroenterology

## 2020-06-30 NOTE — Telephone Encounter (Signed)
The pt has a change in bowels with abd discomfort. No bleeding.  She has not been seen in this office with this complaint in the past.  She has been scheduled to see Colleen on 6/2.  She will call if her pain worsens or if she develops any bleeding

## 2020-07-04 NOTE — Telephone Encounter (Signed)
Tried  to reach pt and it sounded like someone answered the phone but did not speak.  Will try later

## 2020-07-04 NOTE — Telephone Encounter (Signed)
Patient called in. Cancelled appointment for 6/2. States her husband does not want her to come in to spend unnecessary money and wants a call back to discuss what can be done. Patient would like a call back in the afternoon after 1 if possible [phone]

## 2020-07-05 NOTE — Telephone Encounter (Signed)
I spoke with the pt and she asked the appt to be cancelled.  I have cancelled the appt and she will call back when/if she wants reschedule.

## 2020-07-06 ENCOUNTER — Ambulatory Visit: Payer: Medicare Other | Admitting: Nurse Practitioner

## 2020-07-21 ENCOUNTER — Telehealth: Payer: Self-pay | Admitting: Cardiology

## 2020-07-21 NOTE — Telephone Encounter (Signed)
PT is calling with questions about her results.

## 2020-07-21 NOTE — Telephone Encounter (Signed)
Reviewed/reiterated echo results with patient again. She was grateful for call.

## 2021-03-16 DIAGNOSIS — N8110 Cystocele, unspecified: Secondary | ICD-10-CM

## 2021-04-08 NOTE — Progress Notes (Signed)
Office Visit Note  Patient: Angela Ortiz             Date of Birth: 1950/09/11           MRN: [id number]             PCP: Ronita Hipps, MD Referring: Ronita Hipps, MD Visit Date: 04/09/2021   Subjective:  Follow-up (Improving, bil hand and bil feet coldness and turning dark color, bil hand pain)   History of Present Illness: Angela Ortiz is a 71 y.o. female here for follow up for raynaud's symptoms with pernio of toes on amlodipine 5 mg daily. She has seen improvement in finger and toe discoloration on the amlodipine although some symptoms returned over winter months. She decrased gabapentin dose by half without severe worsening of muscular pains and extremity itching or burning. Switched celexa to vilazodone with benefit. Not requiring topical steroids for symptoms.  Previous HPI 04/10/20 Angela Ortiz is a 71 y.o. female here for follow up of raynaud's phenomenon and skin rashes on amlodipine 5 mg daily and topical steroids as needed. Since the last visit her skin has improved a lot and she stopped using the steroid due to not enough symptoms bothering her. She had a left wrist ganglion cyst excision surgery complicated by pseudoaneurysm requiring another surgery for evacuation and is recovering from this. Overall feels pleased with symptoms improvement at this time.   Review of Systems  Constitutional:  Positive for fatigue.  HENT:  Positive for mouth dryness.   Eyes:  Negative for dryness.  Respiratory:  Negative for shortness of breath.   Cardiovascular:  Negative for swelling in legs/feet.  Gastrointestinal:  Positive for constipation and diarrhea.  Endocrine: Positive for cold intolerance.  Genitourinary:  Negative for difficulty urinating.  Musculoskeletal:  Positive for joint pain, joint pain, joint swelling, muscle weakness, morning stiffness and muscle tenderness.  Skin:  Negative for rash.  Allergic/Immunologic: Negative for susceptible to  infections.  Neurological:  Positive for numbness and weakness.  Hematological:  Negative for bruising/bleeding tendency.  Psychiatric/Behavioral:  Negative for sleep disturbance.     PMFS History:  Patient Active Problem List   Diagnosis Date Noted   Ganglion cyst of volar aspect of left wrist 03/28/2020   Chilblains 01/11/2020   Headache disorder 11/15/2019   Rash and other nonspecific skin eruption 11/15/2019   Arthralgia 11/15/2019   Osteoarthritis of knees, bilateral 11/15/2019   Raynaud phenomenon 11/15/2019   Shortness of breath 10/05/2019   Abnormal EKG 10/05/2019   Essential hypertension 10/05/2019   Mixed hyperlipidemia 10/05/2019   Nuclear sclerosis of both eyes 05/07/2017   Vitreous floaters of both eyes 05/07/2017   Fibromyalgia 04/17/2017   Unspecified convulsions ([place]) 06/14/2014   Bipolar affective disorder, manic, severe ([place]) 06/08/2014   Encounter for preadmission testing    Mania ([place])    Anxiety 05/18/2014   Panic disorder 05/18/2014   Insomnia 05/18/2014    Past Medical History:  Diagnosis Date   Allergy    seasonal   Anxiety    Bipolar disorder ([place])    Cancer ([place]) 1986?   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   Chronic headache    Depression    Elevated cholesterol    Fibromyalgia    Hypertension     Family History  Problem Relation Age of Onset   Diabetes Father    Heart disease Father    Cancer Mother    Osteoarthritis Mother    Breast cancer  Neg Hx    Colon cancer Neg Hx    Esophageal cancer Neg Hx    Colon polyps Neg Hx    Rectal cancer Neg Hx    Stomach cancer Neg Hx    Past Surgical History:  Procedure Laterality Date   ABDOMINAL HYSTERECTOMY     AUGMENTATION MAMMAPLASTY Bilateral    removed april 2019   BREAST ENHANCEMENT SURGERY     BREAST IMPLANT REMOVAL Bilateral 05/09/2017   Procedure: BILATERAL  REMOVAL BREAST IMPLANTS;  Surgeon: Irene Limbo, MD;  Location: [facility];  Service: Plastics;  Laterality: Bilateral;    CAPSULECTOMY Left 05/09/2017   Procedure: CAPSULECTOMY AND REMOVAL OF IMPLANT MATERIAL ON LEFT;  Surgeon: Irene Limbo, MD;  Location: [facility];  Service: Plastics;  Laterality: Left;   ESOPHAGOGASTRODUODENOSCOPY     over 30 years ago   explants Bilateral    GANGLION CYST EXCISION Left 03/21/2020   Procedure: EXCISION RECURRENT GANGLION LEFT WRIST;  Surgeon: [name], MD;  Location: [facility];  Service: Orthopedics;  Laterality: Left;  45 MINUTES   HEMATOMA EVACUATION Left 04/06/2020   Procedure: EVACUATION HEMATOMA LEFT WRIST;  Surgeon: [name], MD;  Location: [facility];  Service: Orthopedics;  Laterality: Left;   NERVE, TENDON AND ARTERY REPAIR Left 04/06/2020   Procedure: ARTERY REPAIR LEFT WRIST;  Surgeon: [name], MD;  Location: [facility];  Service: Orthopedics;  Laterality: Left;   right arm surgery     ULNA OSTEOTOMY  1992   partical   VAGINAL PROLAPSE REPAIR     Social History   Social History Narrative   Not on file   Immunization History  Administered Date(s) Administered   Moderna Sars-Covid-2 Vaccination 03/18/2019, 04/15/2019, 11/29/2019     Objective: Vital Signs: BP 128/81 (BP Location: Left Arm, Patient Position: Sitting, Cuff Size: Normal)   Pulse 63   Resp 16   Ht '5\' 6"'$  (1.676 m)   Wt 177 lb (80.3 kg)   BMI 28.57 kg/m    Physical Exam Cardiovascular:     Rate and Rhythm: Normal rate and regular rhythm.  Pulmonary:     Effort: Pulmonary effort is normal.     Breath sounds: Normal breath sounds.  Skin:    General: Skin is warm and dry.     Comments: Skin picking around fingernails on both hands Digital pitting or nail changes No acral cyanosis erythema or discoloration  Neurological:     Mental Status: She is alert.  Psychiatric:        Mood and Affect: Mood normal.      Musculoskeletal Exam:  Does not have severe localized pain symptoms but mild soreness to  touch throughout extremities Shoulders full ROM no tenderness or swelling Elbows full ROM no tenderness or swelling Wrists full ROM no tenderness or swelling Fingers full ROM no tenderness or swelling Muscle knots present in upper back and paraspinal muscles some radiation of pain with pressure over these tender points Knees full ROM no tenderness or swelling Ankles full ROM no tenderness or swelling    Investigation: No additional findings.  Imaging: No results found.  Recent Labs: Lab Results  Component Value Date   WBC 6.6 06/08/2021   HGB 13.4 06/08/2021   PLT 321 06/08/2021   NA 142 06/08/2021   K 4.0 06/08/2021   CL 105 06/08/2021   CO2 25 06/08/2021   GLUCOSE 106 (H) 06/08/2021   BUN 21 06/08/2021  CREATININE 0.78 06/08/2021   BILITOT 0.4 06/08/2021   ALKPHOS 66 06/08/2021   AST 22 06/08/2021   ALT 12 06/08/2021   PROT 7.9 06/08/2021   ALBUMIN 4.8 06/08/2021   CALCIUM 9.9 06/08/2021   GFRAA >60 06/07/2014    Speciality Comments: No specialty comments available.  Procedures:  No procedures performed Allergies: Patient has no known allergies.   Assessment / Plan:     Visit Diagnoses: Chilblains, subsequent encounter Raynaud's phenomenon without gangrene - Plan: amLODipine (NORVASC) 5 MG tablet  Raynaud's symptoms remain improved on the low-dose amlodipine and not developing significant vasculitic changes around fingers and toes.  Some changes on exam today is more associated with skin picking not any type of pitting or atrophy.  Recommend continuing amlodipine 5 mg daily.  Fibromyalgia Arthralgia, unspecified joint  Diffuse joint and muscular pains throughout consistent with fibromyalgia and myofascial pain syndrome.  Does have tender points of musculature throughout the back with some radiation of symptoms.  She is already on SSRI and benzodiazepine medication and gabapentin.  Does have some generalized primary osteoarthritis in multiple areas but I think  this is not the primary cause of most of the complaints. Recommended she could try titrating the gabapentin dose and see if this provides any benefit.  Instructions for checking at [facility] patient fibromyalgia guide for self-care recommendations.  Orders: No orders of the defined types were placed in this encounter.  Meds ordered this encounter  Medications   amLODipine (NORVASC) 5 MG tablet    Sig: Take 1 tablet (5 mg total) by mouth daily.    Dispense:  90 tablet    Refill:  3     Follow-Up Instructions: Return in about 1 year (around 04/10/2022) for [place]/FMS/OA f/u 41yr   CCollier Salina MD  Note - This record has been created using [employer]  Chart creation errors have been sought, but may not always  have been located. Such creation errors do not reflect on  the standard of medical care.

## 2021-04-09 ENCOUNTER — Ambulatory Visit (INDEPENDENT_AMBULATORY_CARE_PROVIDER_SITE_OTHER): Payer: Medicare Other | Admitting: Internal Medicine

## 2021-04-09 ENCOUNTER — Other Ambulatory Visit: Payer: Self-pay

## 2021-04-09 ENCOUNTER — Encounter: Payer: Self-pay | Admitting: Internal Medicine

## 2021-04-09 VITALS — BP 128/81 | HR 63 | Resp 16 | Ht 66.0 in | Wt 177.0 lb

## 2021-04-09 DIAGNOSIS — I7300 Raynaud's syndrome without gangrene: Secondary | ICD-10-CM

## 2021-04-09 DIAGNOSIS — M797 Fibromyalgia: Secondary | ICD-10-CM | POA: Diagnosis not present

## 2021-04-09 DIAGNOSIS — M2550 Pain in unspecified joint: Secondary | ICD-10-CM

## 2021-04-09 DIAGNOSIS — R21 Rash and other nonspecific skin eruption: Secondary | ICD-10-CM

## 2021-04-09 DIAGNOSIS — T691XXD Chilblains, subsequent encounter: Secondary | ICD-10-CM

## 2021-04-09 MED ORDER — AMLODIPINE BESYLATE 5 MG PO TABS: 5.0000 mg | ORAL_TABLET | Freq: Every day | ORAL | 3 refills | Status: AC

## 2021-04-09 NOTE — Patient Instructions (Addendum)
You can try increasing the gabapentin dose again to see if this reduces the symptoms more as an initial step. ? ?I recommend checking out the [facility] patient-centered guide for fibromyalgia and chronic pain management: ?[email or web address] ? ?

## 2021-06-08 ENCOUNTER — Encounter (HOSPITAL_COMMUNITY): Payer: Self-pay

## 2021-06-08 ENCOUNTER — Other Ambulatory Visit: Payer: Self-pay

## 2021-06-08 ENCOUNTER — Emergency Department (HOSPITAL_COMMUNITY)
Admission: EM | Admit: 2021-06-08 | Discharge: 2021-06-09 | Disposition: A | Discharge status: Other Acute Inpatient Hospital | Payer: Medicare Other | Attending: Emergency Medicine | Admitting: Emergency Medicine

## 2021-06-08 DIAGNOSIS — R45851 Suicidal ideations: Secondary | ICD-10-CM | POA: Diagnosis not present

## 2021-06-08 DIAGNOSIS — F309 Manic episode, unspecified: Secondary | ICD-10-CM | POA: Insufficient documentation

## 2021-06-08 DIAGNOSIS — Z20822 Contact with and (suspected) exposure to covid-19: Secondary | ICD-10-CM | POA: Insufficient documentation

## 2021-06-08 DIAGNOSIS — Z85830 Personal history of malignant neoplasm of bone: Secondary | ICD-10-CM | POA: Insufficient documentation

## 2021-06-08 DIAGNOSIS — R450 Nervousness: Secondary | ICD-10-CM | POA: Diagnosis present

## 2021-06-08 DIAGNOSIS — I10 Essential (primary) hypertension: Secondary | ICD-10-CM | POA: Insufficient documentation

## 2021-06-08 LAB — COMPREHENSIVE METABOLIC PANEL
ALT: 12 U/L (ref 0–44)
AST: 22 U/L (ref 15–41)
Albumin: 4.8 g/dL (ref 3.5–5.0)
Alkaline Phosphatase: 66 U/L (ref 38–126)
Anion gap: 12 (ref 5–15)
BUN: 21 mg/dL (ref 8–23)
CO2: 25 mmol/L (ref 22–32)
Calcium: 9.9 mg/dL (ref 8.9–10.3)
Chloride: 105 mmol/L (ref 98–111)
Creatinine, Ser: 0.78 mg/dL (ref 0.44–1.00)
GFR, Estimated: >60 mL/min (ref >60)
Glucose, Bld: 106 mg/dL — ABNORMAL HIGH (ref 70–99)
Potassium: 4 mmol/L (ref 3.5–5.1)
Sodium: 142 mmol/L (ref 135–145)
Total Bilirubin: 0.4 mg/dL (ref 0.3–1.2)
Total Protein: 7.9 g/dL (ref 6.5–8.1)

## 2021-06-08 LAB — URINALYSIS, ROUTINE W REFLEX MICROSCOPIC
Bacteria, UA: NONE SEEN
Bilirubin Urine: NEGATIVE
Glucose, UA: NEGATIVE mg/dL
Hgb urine dipstick: NEGATIVE
Ketones, ur: 5 mg/dL — AB
Nitrite: NEGATIVE
Protein, ur: NEGATIVE mg/dL
Specific Gravity, Urine: 1.02 (ref 1.005–1.030)
pH: 7 (ref 5.0–8.0)

## 2021-06-08 LAB — CBC
HCT: 40.2 % (ref 36.0–46.0)
Hemoglobin: 13.4 g/dL (ref 12.0–15.0)
MCH: 30.8 pg (ref 26.0–34.0)
MCHC: 33.3 g/dL (ref 30.0–36.0)
MCV: 92.4 fL (ref 80.0–100.0)
Platelets: 321 10*3/uL (ref 150–400)
RBC: 4.35 MIL/uL (ref 3.87–5.11)
RDW: 14.4 % (ref 11.5–15.5)
WBC: 6.6 10*3/uL (ref 4.0–10.5)
nRBC: 0 % (ref 0.0–0.2)

## 2021-06-08 LAB — ETHANOL: Alcohol, Ethyl (B): <10 mg/dL (ref <10)

## 2021-06-08 LAB — SALICYLATE LEVEL: Salicylate Lvl: <7 mg/dL — ABNORMAL LOW (ref 7.0–30.0)

## 2021-06-08 LAB — RAPID URINE DRUG SCREEN, HOSP PERFORMED
Amphetamines: NOT DETECTED
Barbiturates: NOT DETECTED
Benzodiazepines: POSITIVE — AB
Cocaine: NOT DETECTED
Opiates: NOT DETECTED
Tetrahydrocannabinol: POSITIVE — AB

## 2021-06-08 LAB — ACETAMINOPHEN LEVEL: Acetaminophen (Tylenol), Serum: <10 ug/mL — ABNORMAL LOW (ref 10–30)

## 2021-06-08 LAB — RESP PANEL BY RT-PCR (FLU A&B, COVID) ARPGX2
Influenza A by PCR: NEGATIVE
Influenza B by PCR: NEGATIVE
SARS Coronavirus 2 by RT PCR: NEGATIVE

## 2021-06-08 LAB — TSH: TSH: 1.516 u[IU]/mL (ref 0.350–4.500)

## 2021-06-08 MED ORDER — ONDANSETRON HCL 4 MG PO TABS: 4.0000 mg | ORAL_TABLET | Freq: Three times a day (TID) | ORAL | Status: DC | PRN

## 2021-06-08 MED ORDER — LORAZEPAM 1 MG PO TABS
1.0000 mg | ORAL_TABLET | Freq: Three times a day (TID) | ORAL | Status: DC
  Administered 2021-06-08 (×3): 1 mg via ORAL
  Filled 2021-06-08 (×3): qty 1

## 2021-06-08 MED ORDER — ALUM & MAG HYDROXIDE-SIMETH 200-200-20 MG/5ML PO SUSP: 30.0000 mL | Freq: Four times a day (QID) | ORAL | Status: DC | PRN

## 2021-06-08 MED ORDER — ACETAMINOPHEN 325 MG PO TABS
650.0000 mg | ORAL_TABLET | ORAL | Status: DC | PRN
  Administered 2021-06-08 – 2021-06-09 (×2): 650 mg via ORAL
  Filled 2021-06-08 (×2): qty 2

## 2021-06-08 NOTE — Progress Notes (Signed)
Patient has been faxed out per the request of Dr. Dwyane Dee. Patient meets [facility] inpatient criteria per Prescott Gum, NP. Patient has been faxed out to the following facilities:  ? ?[facility]  [address] [place] [address] [phone]  ?[place], [place] [address] [phone]  ?[place]  [place], [place] [address] [phone]  ?[facility]  [address] [place] [address] [phone]  ?[facility]  [address] [place], [place] [address] [phone]  ?[facility]  [place], [place] [address] [phone]  ?[facility]  [address] [place] [address] [phone]  ?[facility]  [address], [place] [address] [phone]  ?[place]  [place] [place] [address] [phone]  ?[facility]  [address], [place] [address] [phone]  ?[facility]  [address], [place] [address] [phone]  ?[facility]  [address], [place] [address] [phone]  ?[place], [place] [address] [phone]  ?[facility]  [place], [place] [address] [phone]  ?[facility]  [address] [place] [address] [phone]  ?[place] [address] [place] [address] [phone]  ? ?Mariea Clonts, MSW, LCSW-A  ?9:41 PM 06/08/2021   ?
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
## 2021-06-08 NOTE — ED Triage Notes (Signed)
Patient reports that she is bipolar and is feeling anxious and depressed and having manic episodes x 3 months. Patient and patient's husband reports that she has been leaving and not telling her family where she was. ? ?Patient states she is having suicidal thoughts ,but denies a plan. Patient is tearful in triage. ? ?Patient also reports that she binge drink with wine and states she had a "small bottle wine" last night. ?Patient denies visual or auditory hallucinations or HI. ? ?Patient's husband reports that when she runs away she does not take her meds. ?

## 2021-06-09 NOTE — Progress Notes (Signed)
06/09/2021  0740  Called safe transport [phone] to transport patient to [place]. ?

## 2021-06-09 NOTE — ED Provider Notes (Signed)
Emergency Medicine Observation Re-evaluation Note ? ?Angela Ortiz is a 71 y.o. female, seen on rounds today at 0700.  Pt initially presented to the ED for complaints of Anxiety, Depression, and Suicidal ?Currently, the patient is resting comfortably. ? ?Physical Exam  ?BP (!) 145/69 (BP Location: Left Arm)   Pulse 72   Temp 97.7 ?F (36.5 ?C) (Oral)   Resp 18   Ht '5\' 6"'$  (1.676 m)   Wt 68 kg   SpO2 98%   BMI 24.21 kg/m?  ?Physical Exam ?General: NAD ? ?ED Course / MDM  ?EKG:  ? ?I have reviewed the labs performed to date as well as medications administered while in observation.  Recent changes in the last 24 hours include no acute events reported. ? ?Plan  ?Current plan is for geripsych placement - [facility]. ? Shandria Clinch is not under involuntary commitment. ? ? ?  ?Valarie Merino, MD ?06/09/21 [phone] ? ?

## 2021-06-09 NOTE — Progress Notes (Signed)
06/09/2021  0719  Called Sheriff to arrange transport to [facility]. Waiting for call back. ?

## 2021-06-09 NOTE — ED Notes (Signed)
Angela Ortiz with Pt access called and spoke to this RN. Pt has been accepted to [facility] by Dr Franchot Mimes. Report is to be called to [phone] [facility]. ?

## 2021-06-09 NOTE — Progress Notes (Signed)
06/09/2021 0815  Called [place] and gave report to Diane. ?

## 2021-06-09 NOTE — ED Notes (Signed)
[employer] called back to advise that transport would not be happening until the morning. Day nurse to call [phone] in the morning to arrange transport. ?

## 2021-06-09 NOTE — Progress Notes (Signed)
06/09/2021  0802  called [employer] [employer] [phone] twice to give report with no answer. ?

## 2021-06-09 NOTE — ED Notes (Signed)
Spoke with [employer]. They are working on a police escort to [facility] for pt. ? ?

## 2021-10-15 IMAGING — US US EXTREM UP*L* LTD
2 series · 14 of 25 positions shown · non-contrast
Comparison: None.

CLINICAL DATA: Left volar wrist mass. History of prior ganglion
cyst excision over 30 years ago.

EXAM:
ULTRASOUND LEFT UPPER EXTREMITY LIMITED
TECHNIQUE: Ultrasound examination of the upper extremity soft tissues was
performed in the area of clinical concern.

[Series 1: us extrem up*left* ltd · 0.06mm/px · 23 acquisitions, 10 frames shown (1 of 2)]
[im 1/23]
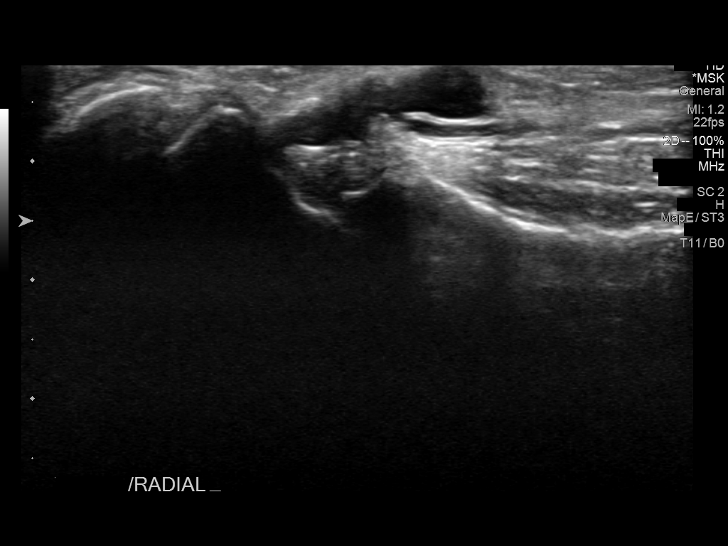
[im 3/23]
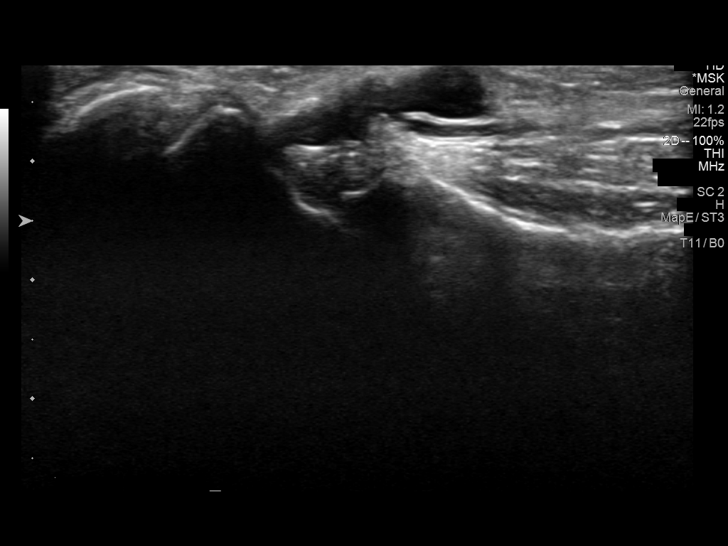
[im 6/23]
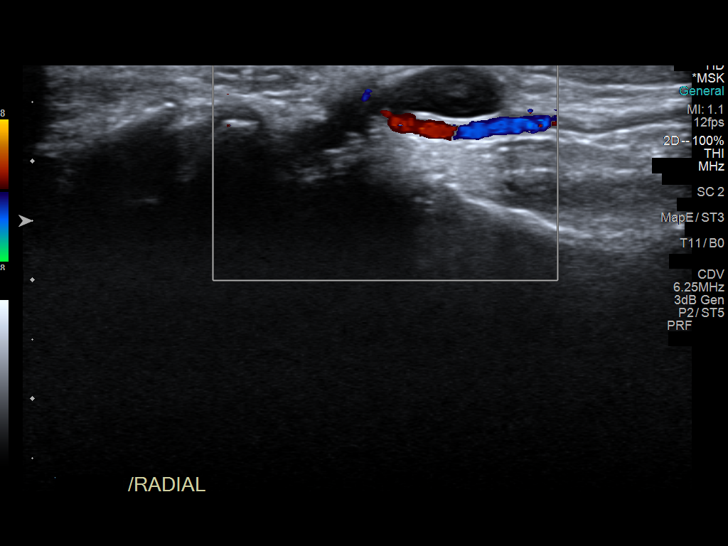
[im 8/23]
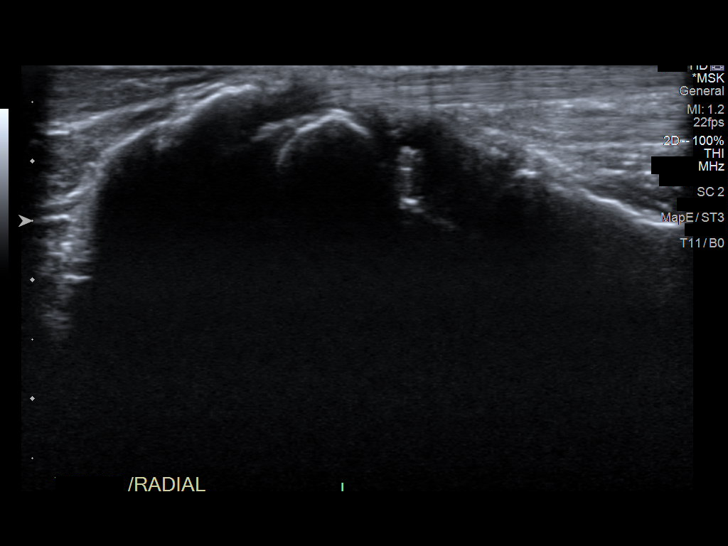
[im 11/23]
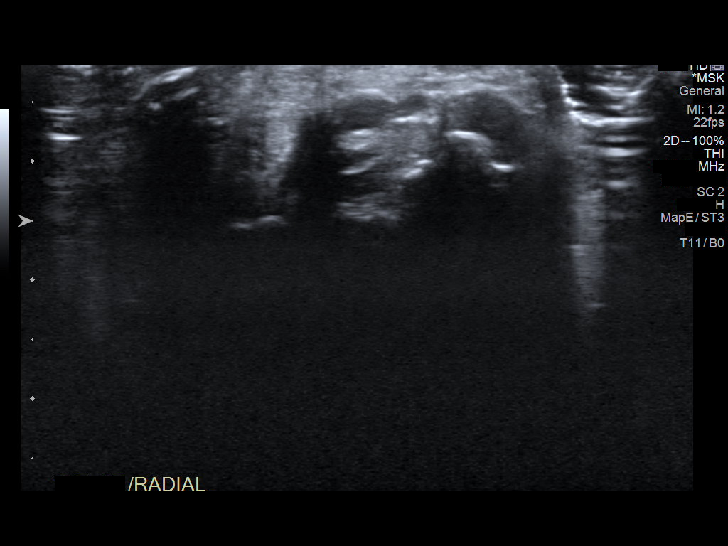
[im 12/23]
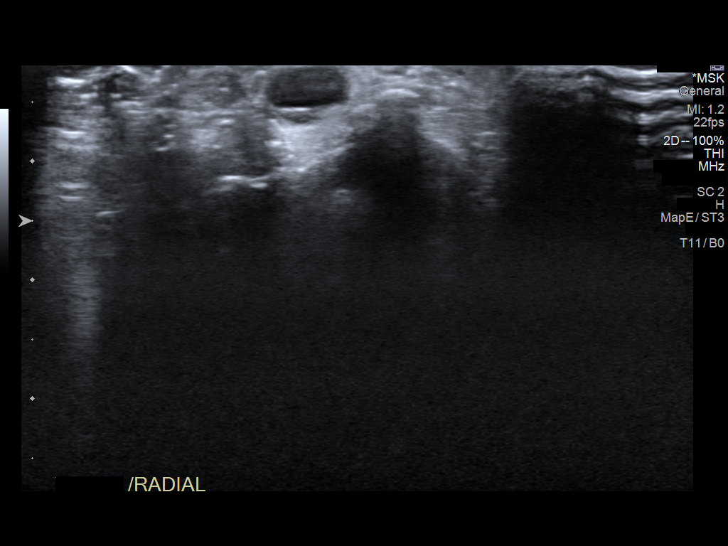
[im 15/23]
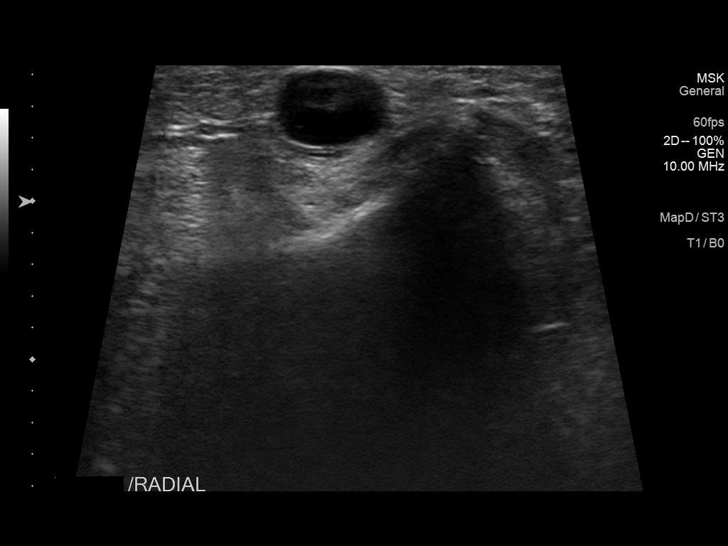
[im 17/23]
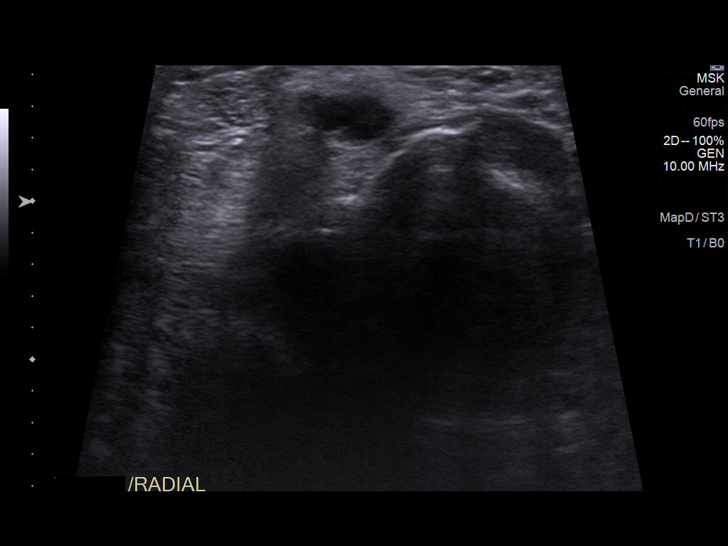
[im 20/23]
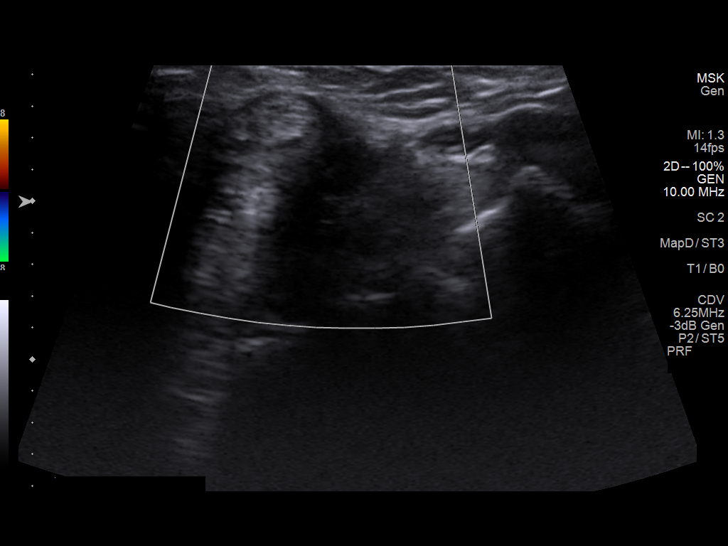
[im 21/23]
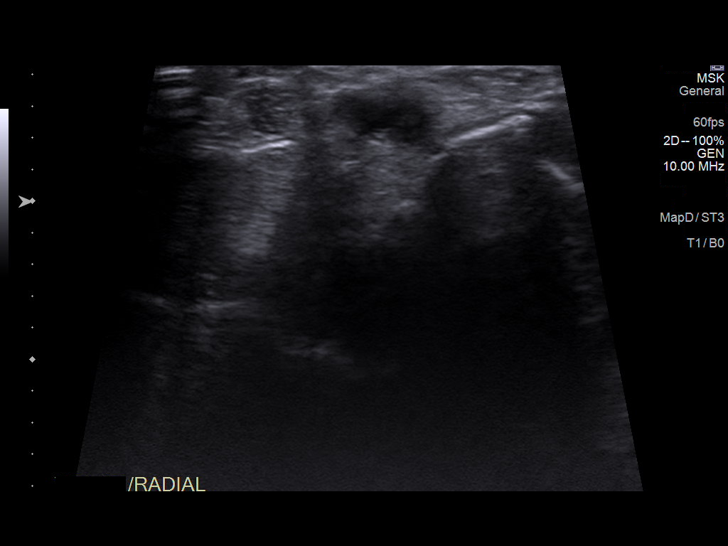

[Series 2: us extrem up*left* ltd · 0.04mm/px · 4 of 9 slices shown (2 of 2)]
[im 1/9]
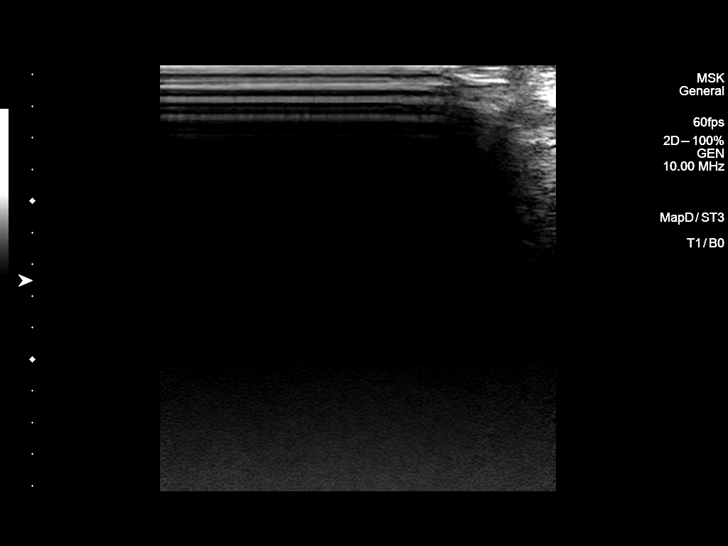
[im 3/9]
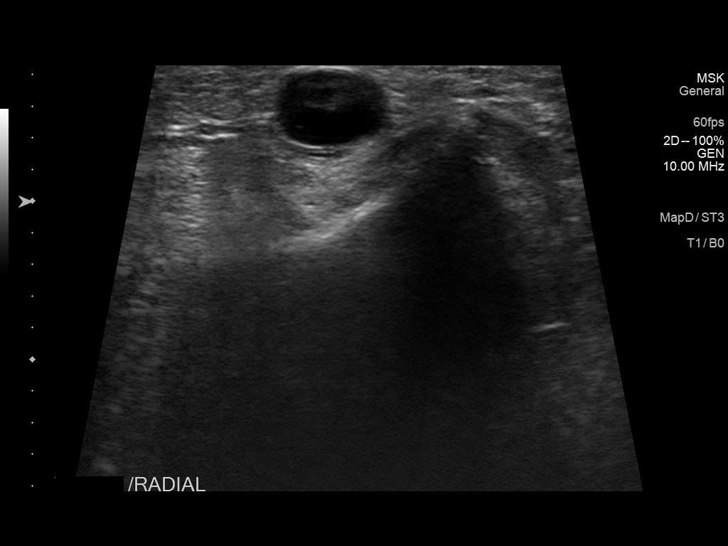
[im 6/9]
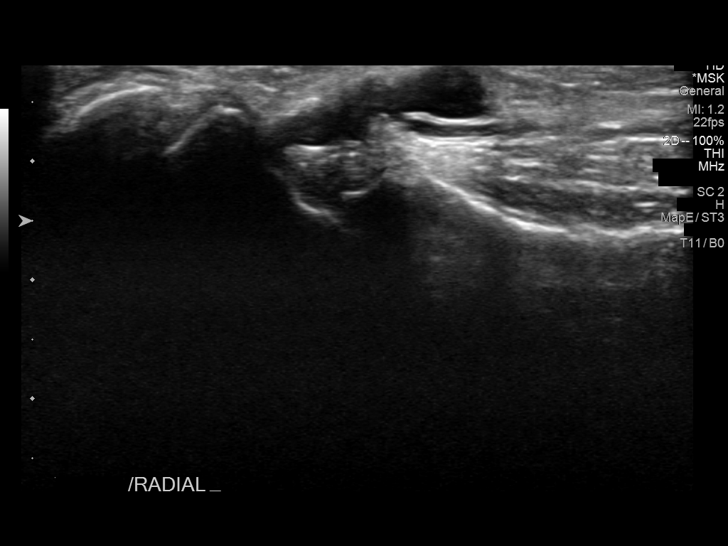
[im 9/9]
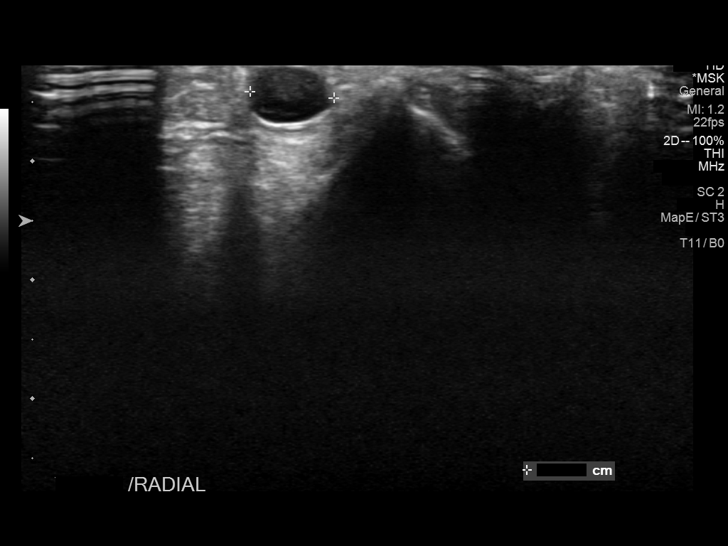

[14 of 25 positions shown; findings below may reference images not displayed]

FINDINGS: Focused ultrasound of the volar and radial aspect of the left wrist
in the area of the palpable abnormality demonstrates a well-defined
1.8 x 0.5 x 0.7 cm anechoic mass with posterior acoustic enhancement
and no internal vascularity.
IMPRESSION: 1. Palpable abnormality corresponds to a 1.8 cm ganglion cyst.
